# Patient Record
Sex: Female | Born: 1970 | Race: White | Hispanic: Yes | Marital: Married | Smoking: Never smoker
Health system: Southern US, Community
[De-identification: ages and names within clinical notes are randomized; demographics above are authoritative.]

## PROBLEM LIST (undated history)

## (undated) DIAGNOSIS — R011 Cardiac murmur, unspecified: Secondary | ICD-10-CM

## (undated) DIAGNOSIS — G43909 Migraine, unspecified, not intractable, without status migrainosus: Secondary | ICD-10-CM

## (undated) DIAGNOSIS — L729 Follicular cyst of the skin and subcutaneous tissue, unspecified: Secondary | ICD-10-CM

## (undated) HISTORY — PX: EYE SURGERY: SHX253

## (undated) HISTORY — DX: Migraine, unspecified, not intractable, without status migrainosus: G43.909

## (undated) HISTORY — DX: Follicular cyst of the skin and subcutaneous tissue, unspecified: L72.9

---

## 2005-08-06 ENCOUNTER — Ambulatory Visit: Payer: Self-pay | Admitting: Internal Medicine

## 2006-01-23 ENCOUNTER — Ambulatory Visit: Payer: Self-pay | Admitting: Internal Medicine

## 2006-02-11 ENCOUNTER — Ambulatory Visit: Payer: Self-pay | Admitting: Internal Medicine

## 2007-05-25 ENCOUNTER — Ambulatory Visit: Payer: Self-pay | Admitting: Internal Medicine

## 2011-12-10 ENCOUNTER — Ambulatory Visit: Payer: Self-pay

## 2011-12-10 LAB — CBC WITH DIFFERENTIAL/PLATELET
Basophil #: 0 10*3/uL (ref 0.0–0.1)
HCT: 39.1 % (ref 35.0–47.0)
HGB: 12.8 g/dL (ref 12.0–16.0)
Lymphocyte %: 31.6 %
MCHC: 32.8 g/dL (ref 32.0–36.0)
MCV: 84 fL (ref 80–100)
Monocyte #: 0.5 x10 3/mm (ref 0.2–0.9)
Monocyte %: 9.1 %
Neutrophil #: 3.2 10*3/uL (ref 1.4–6.5)
RBC: 4.64 10*6/uL (ref 3.80–5.20)
RDW: 13.7 % (ref 11.5–14.5)

## 2011-12-10 LAB — SEDIMENTATION RATE: Erythrocyte Sed Rate: 7 mm/hr (ref 0–20)

## 2011-12-10 LAB — URIC ACID: Uric Acid: 2.8 mg/dL (ref 2.6–6.0)

## 2012-07-01 DIAGNOSIS — L729 Follicular cyst of the skin and subcutaneous tissue, unspecified: Secondary | ICD-10-CM

## 2012-07-01 HISTORY — DX: Follicular cyst of the skin and subcutaneous tissue, unspecified: L72.9

## 2013-06-07 ENCOUNTER — Encounter: Payer: Self-pay | Admitting: *Deleted

## 2013-06-17 ENCOUNTER — Ambulatory Visit (INDEPENDENT_AMBULATORY_CARE_PROVIDER_SITE_OTHER): Payer: BC Managed Care – PPO | Admitting: General Surgery

## 2013-06-17 ENCOUNTER — Encounter: Payer: Self-pay | Admitting: General Surgery

## 2013-06-17 VITALS — BP 110/68 | HR 70 | Resp 12 | Ht 64.0 in | Wt 118.0 lb

## 2013-06-17 DIAGNOSIS — L0231 Cutaneous abscess of buttock: Secondary | ICD-10-CM

## 2013-06-17 NOTE — Patient Instructions (Signed)
Patient to return in 4-6 weeks

## 2013-06-17 NOTE — Progress Notes (Signed)
Patient ID: Denise Logan, female   DOB: October 03, 1970, 42 y.o.   MRN: [id number]  Chief Complaint  Patient presents with  . Cyst    left buttock    HPI Cambell Logan is a 42 y.o. female.  Here today for evaluation of a left buttock cyst.Patient states the abscess is been there for a year,never drained or busted before. She states on 06/06/13 she started having pain and saw Dr. Welton Flakes on the 06/07/13. Patient states she think it is trying to drain as of last night. HPI  Past Medical History  Diagnosis Date  . Cyst of buttocks 2014    Past Surgical History  Procedure Laterality Date  . Eye surgery Bilateral     Family History  Problem Relation Age of Onset  . Breast cancer Mother     Social History History  Substance Use Topics  . Smoking status: Never Smoker   . Smokeless tobacco: Never Used  . Alcohol Use: Yes    No Known Allergies  No current outpatient prescriptions on file.   No current facility-administered medications for this visit.    Review of Systems Review of Systems  Blood pressure 110/68, pulse 70, resp. rate 12, height 5\' 4"  (1.626 m), weight 118 lb (53.524 kg), last menstrual period 05/27/2013.  Physical Exam Physical Exam  Constitutional: She appears well-developed and well-nourished.  Skin:  3cm red swollen area with 3 tiny holes -pus seen through the openings. Located left upper inner gluteal area. Withe 1 ml 1% xylocaine and betadine prep, the openings were connected and 3-4 ml thick yellow pus evacuated.    Data Reviewed    Assessment    Left gluteal abscess.     Plan    Complete her course of antibiotic.         Christella App G 06/21/2013, 8:21 AM

## 2013-06-21 ENCOUNTER — Encounter: Payer: Self-pay | Admitting: General Surgery

## 2013-06-29 ENCOUNTER — Ambulatory Visit: Payer: Self-pay | Admitting: General Surgery

## 2013-07-20 ENCOUNTER — Encounter: Payer: Self-pay | Admitting: General Surgery

## 2013-07-20 ENCOUNTER — Ambulatory Visit (INDEPENDENT_AMBULATORY_CARE_PROVIDER_SITE_OTHER): Payer: BC Managed Care – PPO | Admitting: General Surgery

## 2013-07-20 VITALS — BP 100/60 | HR 72 | Resp 12 | Ht 64.0 in | Wt 118.8 lb

## 2013-07-20 DIAGNOSIS — L03317 Cellulitis of buttock: Principal | ICD-10-CM

## 2013-07-20 DIAGNOSIS — L0231 Cutaneous abscess of buttock: Secondary | ICD-10-CM

## 2013-07-20 NOTE — Patient Instructions (Signed)
Call for new issues or concerns.

## 2013-07-20 NOTE — Progress Notes (Signed)
Patient ID: Denise Logan, female   DOB: 1971/02/15, 43 y.o.   MRN: [id number]  Chief Complaint  Patient presents with  . Follow-up    gluteal abscess    HPI Denise Logan is a 43 y.o. female.  Here today for one month follow up left gluteal abscess.  States she is doing well. HPI  Past Medical History  Diagnosis Date  . Cyst of buttocks 2014    Past Surgical History  Procedure Laterality Date  . Eye surgery Bilateral     Family History  Problem Relation Age of Onset  . Breast cancer Mother     Social History History  Substance Use Topics  . Smoking status: Never Smoker   . Smokeless tobacco: Never Used  . Alcohol Use: Yes    Allergies  Allergen Reactions  . Codeine Other (See Comments)    headache    No current outpatient prescriptions on file.   No current facility-administered medications for this visit.    Review of Systems Review of Systems  Constitutional: Negative.   Respiratory: Negative.   Cardiovascular: Negative.     Blood pressure 100/60, pulse 72, resp. rate 12, height 5\' 4"  (1.626 m), weight 118 lb 12.8 oz (53.887 kg), last menstrual period 06/25/2013.  Physical Exam Physical Exam  Constitutional: She is oriented to person, place, and time. She appears well-developed and well-nourished.  Neurological: She is alert and oriented to person, place, and time.  Skin: Skin is warm and dry.  Left gluteal abscess has resolved. There is 1/2 inch skin scar that is visible and palpable. No induration or tracking in deeper tissue.    Data Reviewed    Assessment    Left upper gluteal abscess-resolved.     Plan    Return prn        Yarelli Decelles G 07/20/2013, 8:53 PM

## 2013-11-19 ENCOUNTER — Other Ambulatory Visit: Payer: Self-pay

## 2013-11-19 LAB — COMPREHENSIVE METABOLIC PANEL
ALK PHOS: 49 U/L
Albumin: 3.9 g/dL (ref 3.4–5.0)
Anion Gap: 3 — ABNORMAL LOW (ref 7–16)
BUN: 9 mg/dL (ref 7–18)
Bilirubin,Total: 0.4 mg/dL (ref 0.2–1.0)
CALCIUM: 8.9 mg/dL (ref 8.5–10.1)
CHLORIDE: 104 mmol/L (ref 98–107)
CO2: 30 mmol/L (ref 21–32)
Creatinine: 0.75 mg/dL (ref 0.60–1.30)
EGFR (African American): >60
GLUCOSE: 78 mg/dL (ref 65–99)
Osmolality: 271 (ref 275–301)
Potassium: 4.2 mmol/L (ref 3.5–5.1)
SGOT(AST): 25 U/L (ref 15–37)
SGPT (ALT): 13 U/L (ref 12–78)
SODIUM: 137 mmol/L (ref 136–145)
TOTAL PROTEIN: 7.7 g/dL (ref 6.4–8.2)

## 2013-11-19 LAB — CBC WITH DIFFERENTIAL/PLATELET
BASOS PCT: 0.7 %
Basophil #: 0 10*3/uL (ref 0.0–0.1)
Eosinophil #: 0.1 10*3/uL (ref 0.0–0.7)
Eosinophil %: 1.3 %
HCT: 36.6 % (ref 35.0–47.0)
HGB: 12.1 g/dL (ref 12.0–16.0)
Lymphocyte #: 1.5 10*3/uL (ref 1.0–3.6)
Lymphocyte %: 30.2 %
MCH: 27.8 pg (ref 26.0–34.0)
MCHC: 33 g/dL (ref 32.0–36.0)
MCV: 84 fL (ref 80–100)
MONOS PCT: 9.6 %
Monocyte #: 0.5 x10 3/mm (ref 0.2–0.9)
NEUTROS ABS: 3 10*3/uL (ref 1.4–6.5)
Neutrophil %: 58.2 %
Platelet: 213 10*3/uL (ref 150–440)
RBC: 4.36 10*6/uL (ref 3.80–5.20)
RDW: 13.7 % (ref 11.5–14.5)
WBC: 5.1 10*3/uL (ref 3.6–11.0)

## 2013-11-19 LAB — LIPID PANEL
CHOLESTEROL: 179 mg/dL (ref 0–200)
HDL Cholesterol: 67 mg/dL — ABNORMAL HIGH (ref 40–60)
LDL CHOLESTEROL, CALC: 94 mg/dL (ref 0–100)
Triglycerides: 90 mg/dL (ref 0–200)
VLDL Cholesterol, Calc: 18 mg/dL (ref 5–40)

## 2013-11-19 LAB — TSH: THYROID STIMULATING HORM: 1.22 u[IU]/mL

## 2013-11-19 LAB — HEMOGLOBIN A1C: Hemoglobin A1C: 5.7 % (ref 4.2–6.3)

## 2013-12-07 ENCOUNTER — Other Ambulatory Visit: Payer: Self-pay | Admitting: Physician Assistant

## 2013-12-07 LAB — URIC ACID: Uric Acid: 3.2 mg/dL (ref 2.6–6.0)

## 2013-12-07 LAB — SEDIMENTATION RATE: Erythrocyte Sed Rate: 14 mm/hr (ref 0–20)

## 2017-08-01 ENCOUNTER — Ambulatory Visit: Payer: Self-pay | Admitting: Internal Medicine

## 2017-08-13 ENCOUNTER — Ambulatory Visit: Payer: BLUE CROSS/BLUE SHIELD | Admitting: Internal Medicine

## 2017-08-13 ENCOUNTER — Encounter: Payer: Self-pay | Admitting: Internal Medicine

## 2017-08-13 VITALS — BP 101/64 | HR 88 | Resp 16 | Ht 64.0 in | Wt 124.8 lb

## 2017-08-13 DIAGNOSIS — Z124 Encounter for screening for malignant neoplasm of cervix: Secondary | ICD-10-CM

## 2017-08-13 DIAGNOSIS — M545 Low back pain, unspecified: Secondary | ICD-10-CM | POA: Insufficient documentation

## 2017-08-13 DIAGNOSIS — R102 Pelvic and perineal pain: Secondary | ICD-10-CM | POA: Diagnosis not present

## 2017-08-13 DIAGNOSIS — R519 Headache, unspecified: Secondary | ICD-10-CM | POA: Insufficient documentation

## 2017-08-13 DIAGNOSIS — R51 Headache: Secondary | ICD-10-CM | POA: Diagnosis not present

## 2017-08-13 DIAGNOSIS — J301 Allergic rhinitis due to pollen: Secondary | ICD-10-CM | POA: Diagnosis not present

## 2017-08-13 DIAGNOSIS — R300 Dysuria: Secondary | ICD-10-CM | POA: Diagnosis not present

## 2017-08-13 DIAGNOSIS — Z0001 Encounter for general adult medical examination with abnormal findings: Secondary | ICD-10-CM

## 2017-08-13 DIAGNOSIS — M064 Inflammatory polyarthropathy: Secondary | ICD-10-CM | POA: Insufficient documentation

## 2017-08-13 NOTE — Progress Notes (Signed)
[facility] [place], [place] [address]  Internal MEDICINE  Office Visit Note  Patient Name: Denise Logan  [id number]  [id number]  Date of Service: 08/13/2017  Chief Complaint  Patient presents with  . Annual Exam    with pap     HPI Pt is here for routine health maintenance examination Headaches associated with her cycle Ocassional pelvic pain    Current Medication: Outpatient Encounter Medications as of 08/13/2017  Medication Sig  . butalbital-aspirin-caffeine (FIORINAL) 50-325-40 MG capsule Take 1 capsule by mouth every 4 (four) hours as needed for headache.   No facility-administered encounter medications on file as of 08/13/2017.     Surgical History: Past Surgical History:  Procedure Laterality Date  . EYE SURGERY Bilateral     Medical History: Past Medical History:  Diagnosis Date  . Cyst of buttocks 2014    Family History: Family History  Problem Relation Age of Onset  . Breast cancer Mother     Review of Systems  Constitutional: Negative for activity change, appetite change, chills, diaphoresis, fatigue, fever and unexpected weight change.  HENT: Negative for congestion, ear discharge, ear pain, facial swelling, hearing loss, nosebleeds, postnasal drip, rhinorrhea, sinus pressure, sinus pain, sneezing, sore throat, tinnitus, trouble swallowing and voice change.   Eyes: Negative for photophobia, pain, discharge, redness, itching and visual disturbance.  Respiratory: Negative for apnea, cough, choking, chest tightness, shortness of breath, wheezing and stridor.   Cardiovascular: Negative for chest pain, palpitations and leg swelling.  Gastrointestinal: Negative for abdominal distention, abdominal pain, anal bleeding, constipation, diarrhea, nausea and rectal pain.  Endocrine: Negative for cold intolerance, heat intolerance, polydipsia, polyphagia and polyuria.  Genitourinary: Negative for difficulty urinating, flank pain,  frequency, genital sores, hematuria, menstrual problem, pelvic pain, urgency, vaginal bleeding, vaginal discharge and vaginal pain.  Musculoskeletal: Negative for arthralgias, back pain, gait problem, joint swelling, myalgias and neck pain.  Skin: Negative for color change, pallor, rash and wound.  Allergic/Immunologic: Negative for environmental allergies, food allergies and immunocompromised state.  Neurological: Positive for headaches. Negative for dizziness, seizures, syncope, facial asymmetry, speech difficulty, weakness, light-headedness and numbness.  Hematological: Negative for adenopathy. Does not bruise/bleed easily.  Psychiatric/Behavioral: Negative for agitation, behavioral problems, confusion, decreased concentration, dysphoric mood, hallucinations, self-injury, sleep disturbance and suicidal ideas. The patient is not nervous/anxious and is not hyperactive.      Vital Signs: BP 101/64 (BP Location: Left Arm, Patient Position: Sitting)   Pulse 88   Resp 16   Ht 5\' 4"  (1.626 m)   Wt 124 lb 12.8 oz (56.6 kg)   SpO2 100%   BMI 21.42 kg/m    Physical Exam  Constitutional: She appears well-developed and well-nourished. No distress.  HENT:  Head: Normocephalic and atraumatic.  Right Ear: External ear normal.  Left Ear: External ear normal.  Nose: Nose normal.  Mouth/Throat: Oropharynx is clear and moist. No oropharyngeal exudate.  Eyes: Conjunctivae and EOM are normal. Pupils are equal, round, and reactive to light. Right eye exhibits no discharge. Left eye exhibits no discharge. No scleral icterus.  Neck: Normal range of motion. Neck supple. No JVD present. No tracheal deviation present. No thyromegaly present.  Cardiovascular: Normal rate, regular rhythm, normal heart sounds and intact distal pulses. Exam reveals no gallop and no friction rub.  No murmur heard. Pulmonary/Chest: Effort normal and breath sounds normal. No stridor. No respiratory distress. She has no wheezes.  She has no rales. She exhibits no tenderness. Right breast exhibits nipple discharge.  Left breast exhibits no nipple discharge. Breasts are symmetrical.  Abdominal: Soft. Bowel sounds are normal. She exhibits no distension and no mass. There is no tenderness. There is no rebound and no guarding.  Genitourinary: Vagina normal and uterus normal. No vaginal discharge found.  Musculoskeletal: Normal range of motion. She exhibits no edema, tenderness or deformity.  Lymphadenopathy:    She has no cervical adenopathy.  Neurological: She is alert. She has normal reflexes. No cranial nerve deficit. She exhibits normal muscle tone. Coordination normal.  Skin: Skin is warm and dry. No rash noted. She is not diaphoretic. No erythema. No pallor.  Psychiatric: She has a normal mood and affect. Her behavior is normal. Judgment and thought content normal.   Assessment/Plan: 1. Encounter for general adult medical examination with abnormal findings - CBC with Differential/Platelet - Lipid Panel With LDL/HDL Ratio - TSH - T4, free - Urinalysis - Comprehensive metabolic panel  2. Nonintractable headache, unspecified chronicity pattern, unspecified headache type - Take OTC Tylenol prn   3. Allergic rhinitis due to pollen, unspecified seasonality - Flonase prn   4. Encounter for screening for malignant neoplasm of cervix - Pap IG and HPV (high risk) DNA detection  5. Dysuria - Urinalysis, Routine w reflex microscopic  6. Pelvic pain - US PELVIS (TRANSABDOMINAL ONLY); Future - CA 125  General Counseling: Denise Logan verbalizes understanding of the findings of todays visit and agrees with plan of treatment. I have discussed any further diagnostic evaluation that may be needed or ordered today. We also reviewed her medications today. she has been encouraged to call the office with any questions or concerns that should arise related to todays visit.   Orders Placed This Encounter  Procedures  . Urinalysis,  Routine w reflex microscopic    Time spent: [place], MD  Internal Medicine

## 2017-08-14 LAB — URINALYSIS, ROUTINE W REFLEX MICROSCOPIC
BILIRUBIN UA: NEGATIVE
GLUCOSE, UA: NEGATIVE
Ketones, UA: NEGATIVE
Leukocytes, UA: NEGATIVE
NITRITE UA: NEGATIVE
PH UA: 5 (ref 5.0–7.5)
PROTEIN UA: NEGATIVE
RBC UA: NEGATIVE
Specific Gravity, UA: 1.018 (ref 1.005–1.030)
UUROB: 0.2 mg/dL (ref 0.2–1.0)

## 2017-08-14 LAB — CBC WITH DIFFERENTIAL/PLATELET
BASOS ABS: 0 10*3/uL (ref 0.0–0.2)
Basos: 0 %
EOS (ABSOLUTE): 0 10*3/uL (ref 0.0–0.4)
Eos: 1 %
Hematocrit: 39.7 % (ref 34.0–46.6)
Hemoglobin: 13.2 g/dL (ref 11.1–15.9)
Immature Grans (Abs): 0 10*3/uL (ref 0.0–0.1)
Immature Granulocytes: 0 %
Lymphocytes Absolute: 1.3 10*3/uL (ref 0.7–3.1)
Lymphs: 27 %
MCH: 27.4 pg (ref 26.6–33.0)
MCHC: 33.2 g/dL (ref 31.5–35.7)
MCV: 83 fL (ref 79–97)
MONOCYTES: 9 %
MONOS ABS: 0.4 10*3/uL (ref 0.1–0.9)
Neutrophils Absolute: 2.9 10*3/uL (ref 1.4–7.0)
Neutrophils: 63 %
PLATELETS: 305 10*3/uL (ref 150–379)
RBC: 4.81 x10E6/uL (ref 3.77–5.28)
RDW: 13.9 % (ref 12.3–15.4)
WBC: 4.6 10*3/uL (ref 3.4–10.8)

## 2017-08-14 LAB — COMPREHENSIVE METABOLIC PANEL
A/G RATIO: 1.4 (ref 1.2–2.2)
ALK PHOS: 63 IU/L (ref 39–117)
ALT: 26 IU/L (ref 0–32)
AST: 31 IU/L (ref 0–40)
Albumin: 4.8 g/dL (ref 3.5–5.5)
BUN/Creatinine Ratio: 13 (ref 9–23)
BUN: 9 mg/dL (ref 6–24)
Bilirubin Total: 0.4 mg/dL (ref 0.0–1.2)
CHLORIDE: 101 mmol/L (ref 96–106)
CO2: 23 mmol/L (ref 20–29)
Calcium: 9.3 mg/dL (ref 8.7–10.2)
Creatinine, Ser: 0.68 mg/dL (ref 0.57–1.00)
GFR calc Af Amer: 121 mL/min/{1.73_m2} (ref >59)
GFR calc non Af Amer: 105 mL/min/{1.73_m2} (ref >59)
GLOBULIN, TOTAL: 3.4 g/dL (ref 1.5–4.5)
Glucose: 78 mg/dL (ref 65–99)
POTASSIUM: 4 mmol/L (ref 3.5–5.2)
SODIUM: 139 mmol/L (ref 134–144)
Total Protein: 8.2 g/dL (ref 6.0–8.5)

## 2017-08-14 LAB — LIPID PANEL WITH LDL/HDL RATIO
Cholesterol, Total: 221 mg/dL — ABNORMAL HIGH (ref 100–199)
HDL: 84 mg/dL (ref >39)
LDL Calculated: 122 mg/dL — ABNORMAL HIGH (ref 0–99)
LDL/HDL RATIO: 1.5 ratio (ref 0.0–3.2)
Triglycerides: 77 mg/dL (ref 0–149)
VLDL CHOLESTEROL CAL: 15 mg/dL (ref 5–40)

## 2017-08-14 LAB — TSH: TSH: 1.25 u[IU]/mL (ref 0.450–4.500)

## 2017-08-14 LAB — T4, FREE: Free T4: 1.07 ng/dL (ref 0.82–1.77)

## 2017-08-14 LAB — CA 125: CANCER ANTIGEN (CA) 125: 17.5 U/mL (ref 0.0–38.1)

## 2017-08-15 LAB — PAP IG AND HPV HIGH-RISK
HPV, high-risk: NEGATIVE
PAP Smear Comment: 0

## 2017-08-18 ENCOUNTER — Ambulatory Visit: Payer: BLUE CROSS/BLUE SHIELD

## 2017-08-18 DIAGNOSIS — R102 Pelvic and perineal pain: Secondary | ICD-10-CM | POA: Diagnosis not present

## 2017-08-22 ENCOUNTER — Telehealth: Payer: Self-pay

## 2017-08-22 NOTE — Telephone Encounter (Signed)
Spoke to husband and advised that all labs are normal.  dbs

## 2017-09-29 ENCOUNTER — Ambulatory Visit: Payer: BLUE CROSS/BLUE SHIELD | Admitting: Nurse Practitioner

## 2017-09-29 ENCOUNTER — Encounter: Payer: Self-pay | Admitting: Nurse Practitioner

## 2017-09-29 VITALS — BP 99/66 | HR 72 | Temp 98.1°F | Resp 16 | Ht 64.0 in | Wt 122.8 lb

## 2017-09-29 DIAGNOSIS — J029 Acute pharyngitis, unspecified: Secondary | ICD-10-CM | POA: Diagnosis not present

## 2017-09-29 DIAGNOSIS — J039 Acute tonsillitis, unspecified: Secondary | ICD-10-CM | POA: Diagnosis not present

## 2017-09-29 LAB — POCT RAPID STREP A (OFFICE): RAPID STREP A SCREEN: NEGATIVE

## 2017-09-29 MED ORDER — AMOXICILLIN 400 MG/5ML PO SUSR: 800.0000 mg | Freq: Two times a day (BID) | ORAL | 0 refills | Status: DC

## 2017-09-29 MED ORDER — FIRST-DUKES MOUTHWASH MT SUSP: OROMUCOSAL | 0 refills | Status: DC

## 2017-09-29 NOTE — Progress Notes (Signed)
[facility] [place], [place] [address]  Internal MEDICINE  Office Visit Note  Patient Name: Denise Logan  [id number]  [id number]  Date of Service: 10/22/2017    Pt is here for a sick visit. Chief Complaint  Patient presents with  . Sore Throat    right side looked in the mirror and seen redness  . Chills  . Cough  . Pain    hurts when eat and yawn  . Dehydration    possibly dehydrated     Sore Throat   This is a new problem. The current episode started in the past 7 days. The problem has been unchanged. There has been no fever. Associated symptoms include congestion, coughing, headaches, swollen glands and trouble swallowing. Pertinent negatives include no abdominal pain, diarrhea, neck pain, shortness of breath or vomiting. She has tried acetaminophen and gargles for the symptoms. The treatment provided mild relief.    Current Medication:  Outpatient Encounter Medications as of 09/29/2017  Medication Sig  . amoxicillin (AMOXIL) 400 MG/5ML suspension Take 10 mLs (800 mg total) by mouth 2 (two) times daily.  . butalbital-aspirin-caffeine (FIORINAL) 50-325-40 MG capsule Take 1 capsule by mouth every 4 (four) hours as needed for headache.  . Diphenhyd-Hydrocort-Nystatin (FIRST-DUKES MOUTHWASH) SUSP Swish and swallow 36mls Q4hours prn sore throat.   No facility-administered encounter medications on file as of 09/29/2017.       Medical History: Past Medical History:  Diagnosis Date  . Cyst of buttocks 2014     Today's Vitals   09/29/17 1648  BP: 99/66  Pulse: 72  Resp: 16  Temp: 98.1 F (36.7 C)  SpO2: 99%  Weight: 122 lb 12.8 oz (55.7 kg)  Height: 5\' 4"  (1.626 m)    Review of Systems  Constitutional: Positive for chills, fatigue and fever. Negative for unexpected weight change.  HENT: Positive for congestion, postnasal drip, sore throat, trouble swallowing and voice change. Negative for rhinorrhea and sneezing.   Eyes: Negative.   Negative for redness.  Respiratory: Positive for cough. Negative for chest tightness, shortness of breath and wheezing.   Cardiovascular: Negative for chest pain and palpitations.  Gastrointestinal: Positive for nausea. Negative for abdominal pain, constipation, diarrhea and vomiting.  Endocrine: Negative for cold intolerance, heat intolerance, polydipsia, polyphagia and polyuria.  Genitourinary: Negative.  Negative for dysuria and frequency.  Musculoskeletal: Positive for back pain. Negative for arthralgias, joint swelling and neck pain.  Skin: Negative for rash.  Allergic/Immunologic: Positive for environmental allergies.  Neurological: Positive for headaches. Negative for tremors and numbness.  Hematological: Negative for adenopathy. Does not bruise/bleed easily.  Psychiatric/Behavioral: Negative for behavioral problems (Depression), dysphoric mood, sleep disturbance and suicidal ideas. The patient is not nervous/anxious.     Physical Exam  Constitutional: She is oriented to person, place, and time. She appears well-developed and well-nourished. No distress.  HENT:  Head: Normocephalic and atraumatic.  Right Ear: Tympanic membrane is erythematous.  Left Ear: Tympanic membrane is erythematous.  Mouth/Throat: Posterior oropharyngeal edema and posterior oropharyngeal erythema present. No oropharyngeal exudate.    Eyes: Pupils are equal, round, and reactive to light. EOM are normal.  Neck: Normal range of motion. Neck supple. No JVD present. No tracheal deviation present. No thyromegaly present.  Cardiovascular: Normal rate, regular rhythm and normal heart sounds. Exam reveals no gallop and no friction rub.  No murmur heard. Pulmonary/Chest: Effort normal and breath sounds normal. No respiratory distress. She has no wheezes. She has no rales. She exhibits no  tenderness.  Abdominal: Soft. Bowel sounds are normal. There is no tenderness.  Musculoskeletal: Normal range of motion.    Lymphadenopathy:    She has cervical adenopathy.  Neurological: She is alert and oriented to person, place, and time. No cranial nerve deficit.  Skin: Skin is warm and dry. She is not diaphoretic.  Psychiatric: She has a normal mood and affect. Her behavior is normal. Judgment and thought content normal.  Nursing note and vitals reviewed.  Assessment/Plan: 1. Sore throat - POCT rapid strep A negative. Will treat for infection based on symptoms.  - Diphenhyd-Hydrocort-Nystatin (FIRST-DUKES MOUTHWASH) SUSP; Swish and swallow 47mls Q4hours prn sore throat.  Dispense: 200 mL; Refill: 0  2. Acute tonsillitis, unspecified etiology Treat with amoxicillin suspension bid for 10 days. Gargle with duke's magic mouthwash and warm salt water as needed. Tylenol/ibuprofen as needed for pain/fever.  - amoxicillin (AMOXIL) 400 MG/5ML suspension; Take 10 mLs (800 mg total) by mouth 2 (two) times daily.  Dispense: 200 mL; Refill: 0  General Counseling: Denise Logan verbalizes understanding of the findings of todays visit and agrees with plan of treatment. I have discussed any further diagnostic evaluation that may be needed or ordered today. We also reviewed her medications today. she has been encouraged to call the office with any questions or concerns that should arise related to todays visit.  Rest and increase fluids. Continue using OTC medication to control symptoms.   This patient was seen by Leretha Pol, FNP- C in Collaboration with Dr Lavera Guise as a part of collaborative care agreement    Orders Placed This Encounter  Procedures  . POCT rapid strep A    Meds ordered this encounter  Medications  . amoxicillin (AMOXIL) 400 MG/5ML suspension    Sig: Take 10 mLs (800 mg total) by mouth 2 (two) times daily.    Dispense:  200 mL    Refill:  0    Order Specific Question:   Supervising Provider    Answer:   Lavera Guise [[id number]]  . Diphenhyd-Hydrocort-Nystatin (FIRST-DUKES MOUTHWASH) SUSP    Sig:  Swish and swallow 90mls Q4hours prn sore throat.    Dispense:  200 mL    Refill:  0    Order Specific Question:   Supervising Provider    Answer:   Lavera Guise [[id number]]    Time spent: 15 Minutes

## 2017-10-22 DIAGNOSIS — J029 Acute pharyngitis, unspecified: Secondary | ICD-10-CM | POA: Insufficient documentation

## 2017-10-22 DIAGNOSIS — J039 Acute tonsillitis, unspecified: Secondary | ICD-10-CM | POA: Insufficient documentation

## 2018-03-13 ENCOUNTER — Encounter: Payer: Self-pay | Admitting: Nurse Practitioner

## 2018-03-13 ENCOUNTER — Ambulatory Visit: Payer: BLUE CROSS/BLUE SHIELD | Admitting: Nurse Practitioner

## 2018-03-13 VITALS — BP 99/66 | HR 93 | Resp 16 | Ht 64.0 in | Wt 120.8 lb

## 2018-03-13 DIAGNOSIS — B3731 Acute candidiasis of vulva and vagina: Secondary | ICD-10-CM | POA: Insufficient documentation

## 2018-03-13 DIAGNOSIS — B373 Candidiasis of vulva and vagina: Secondary | ICD-10-CM | POA: Diagnosis not present

## 2018-03-13 DIAGNOSIS — R300 Dysuria: Secondary | ICD-10-CM | POA: Diagnosis not present

## 2018-03-13 LAB — POCT URINALYSIS DIPSTICK
BILIRUBIN UA: NEGATIVE
Glucose, UA: NEGATIVE
KETONES UA: NEGATIVE
Leukocytes, UA: NEGATIVE
NITRITE UA: NEGATIVE
PH UA: 6 (ref 5.0–8.0)
PROTEIN UA: POSITIVE — AB
RBC UA: NEGATIVE
Spec Grav, UA: 1.025 (ref 1.010–1.025)
UROBILINOGEN UA: 0.2 U/dL

## 2018-03-13 MED ORDER — NYSTATIN 100000 UNIT/GM EX POWD: Freq: Three times a day (TID) | CUTANEOUS | 1 refills | Status: DC

## 2018-03-13 MED ORDER — NYSTATIN 100000 UNIT/GM EX OINT: 1.0000 "application " | TOPICAL_OINTMENT | Freq: Two times a day (BID) | CUTANEOUS | 1 refills | Status: DC

## 2018-03-13 MED ORDER — FLUCONAZOLE 150 MG PO TABS: ORAL_TABLET | ORAL | 0 refills | Status: DC

## 2018-03-13 NOTE — Progress Notes (Signed)
[facility] [place], [place] [address]  Internal MEDICINE  Office Visit Note  Patient Name: Denise Logan  [id number]  [id number]  Date of Service: 03/13/2018   Pt is here for a sick visit.  Chief Complaint  Patient presents with  . Vaginal Itching    pt believes to have yeast infection, she states she feels bumps, itching, and odor . symptoms started a few days ago     Vaginal Itching  The patient's primary symptoms include genital itching, pelvic pain and vaginal discharge. Associated symptoms include back pain, dysuria, headaches and nausea. Pertinent negatives include no abdominal pain, chills, constipation, diarrhea, fever, frequency, rash, sore throat or vomiting.    Patient arrives to primary care today with complaint of recurrent vaginal yeast infection. The first symptoms of itching started three days prior to patient's last periods 8/23 and has intermediately continued up to the present time in spite of the use OTC medication, Monistat.  Patient noticed some whitish vaginal discharge without odor on several occasions. Patient denies any fever, chills, body aches, nausea or any skin rashes. Patient denies the possibility of pregnancy. Patient denies any changes in hygiene, diet or sexual activities. Patient reports occasional pelvic pain but denies any pain at this moment. Pelvic ultrasound was done in February, 2019 due to the same complaint of pelvic pain. Pelvic ultrasound revealed the presence of left ovarian cyst but otherwise was unremarkable. Patient reports occasional burning sensation with urination but denies urgency, frequency or presence of blood in urine.        Current Medication:  Outpatient Encounter Medications as of 03/13/2018  Medication Sig  . amoxicillin (AMOXIL) 400 MG/5ML suspension Take 10 mLs (800 mg total) by mouth 2 (two) times daily. (Patient not taking: Reported on 03/13/2018)  . butalbital-aspirin-caffeine (FIORINAL)  50-325-40 MG capsule Take 1 capsule by mouth every 4 (four) hours as needed for headache.  . Diphenhyd-Hydrocort-Nystatin (FIRST-DUKES MOUTHWASH) SUSP Swish and swallow 54mls Q4hours prn sore throat. (Patient not taking: Reported on 03/13/2018)  . fluconazole (DIFLUCAN) 150 MG tablet Take 1 tablet po once. May repeat dose in 3 days as needed for persistent symptoms.  Marland Kitchen nystatin (MYCOSTATIN/NYSTOP) powder Apply topically 3 (three) times daily.   No facility-administered encounter medications on file as of 03/13/2018.       Medical History: Past Medical History:  Diagnosis Date  . Cyst of buttocks 2014   Today's Vitals   03/13/18 0952  BP: 99/66  Pulse: 93  Resp: 16  SpO2: 98%  Weight: 120 lb 12.8 oz (54.8 kg)  Height: 5\' 4"  (1.626 m)    Review of Systems  Constitutional: Negative for chills, fatigue, fever and unexpected weight change.  HENT: Negative for congestion, postnasal drip, rhinorrhea, sneezing, sore throat, trouble swallowing and voice change.   Eyes: Negative.  Negative for redness.  Respiratory: Negative for cough, chest tightness, shortness of breath and wheezing.   Cardiovascular: Negative for chest pain and palpitations.  Gastrointestinal: Positive for nausea. Negative for abdominal pain, constipation, diarrhea and vomiting.  Endocrine: Negative for cold intolerance, heat intolerance, polydipsia, polyphagia and polyuria.  Genitourinary: Positive for dysuria, pelvic pain and vaginal discharge. Negative for frequency.  Musculoskeletal: Positive for back pain. Negative for arthralgias, joint swelling and neck pain.  Skin: Negative for rash.  Allergic/Immunologic: Positive for environmental allergies.  Neurological: Positive for headaches. Negative for tremors and numbness.  Hematological: Negative for adenopathy. Does not bruise/bleed easily.  Psychiatric/Behavioral: Negative for behavioral problems (Depression), dysphoric mood,  sleep disturbance and suicidal ideas. The  patient is not nervous/anxious.   All other systems reviewed and are negative.   Physical Exam  Constitutional: She is oriented to person, place, and time. She appears well-developed and well-nourished. No distress.  HENT:  Head: Normocephalic and atraumatic.  Mouth/Throat: Oropharynx is clear and moist. No oropharyngeal exudate.  Eyes: Pupils are equal, round, and reactive to light. EOM are normal.  Neck: Normal range of motion. Neck supple. No JVD present. No tracheal deviation present. No thyromegaly present.  Cardiovascular: Normal rate, regular rhythm and normal heart sounds. Exam reveals no gallop and no friction rub.  No murmur heard. Pulmonary/Chest: Effort normal and breath sounds normal. No respiratory distress. She has no wheezes. She has no rales. She exhibits no tenderness.  Abdominal: Soft. Bowel sounds are normal.  Genitourinary:  Genitourinary Comments: Due to patient's complaint of occasional pelvic pain and burning sensation with urination, uranalysis was ordered to check for UTI. UA came positive only for presence of protein in urine.  Musculoskeletal: Normal range of motion.  Lymphadenopathy:    She has no cervical adenopathy.  Neurological: She is alert and oriented to person, place, and time. No cranial nerve deficit.  Skin: Skin is warm and dry. She is not diaphoretic.  Psychiatric: She has a normal mood and affect. Her behavior is normal. Judgment and thought content normal.  Nursing note and vitals reviewed.  Assessment/Plan:   1. Vaginal candidiasis Diflucan 150mg  once. May repeat in three days for persistent symptoms. Add nystatin cream three times daily. - fluconazole (DIFLUCAN) 150 MG tablet; Take 1 tablet po once. May repeat dose in 3 days as needed for persistent symptoms.  Dispense: 3 tablet; Refill: 0 - nystatin (MYCOSTATIN/NYSTOP) powder; Apply topically 3 (three) times daily.  Dispense: 60 g; Refill: 1  2. Dysuria - POCT Urinalysis Dipstick positive  for protein only. No evidence uti.  Patient was prescribed Diflucan and Nystatin cream to help to relieve the symptoms. Patient was educated about diet such as decrease sugar intake, try to take probiotics and increase fluid intake. The follow up visit set up in 2 weeks if symptoms do not get better.    General Counseling: Denise Logan understanding of the findings of todays visit and agrees with plan of treatment. I have discussed any further diagnostic evaluation that may be needed or ordered today. We also reviewed her medications today. she has been encouraged to call the office with any questions or concerns that should arise related to todays visit.    Counseling:  This patient was seen by Leretha Pol FNP Collaboration with Dr Lavera Guise as a part of collaborative care agreement  Orders Placed This Encounter  Procedures  . POCT Urinalysis Dipstick    Meds ordered this encounter  Medications  . fluconazole (DIFLUCAN) 150 MG tablet    Sig: Take 1 tablet po once. May repeat dose in 3 days as needed for persistent symptoms.    Dispense:  3 tablet    Refill:  0    Order Specific Question:   Supervising Provider    Answer:   Lavera Guise [[id number]]  . nystatin (MYCOSTATIN/NYSTOP) powder    Sig: Apply topically 3 (three) times daily.    Dispense:  60 g    Refill:  1    Order Specific Question:   Supervising Provider    Answer:   Lavera Guise [[id number]]    Time spent: 15 Minutes

## 2018-03-13 NOTE — Addendum Note (Signed)
Addended by: Leretha Pol on: 03/13/2018 01:55 PM   Modules accepted: Orders

## 2018-03-27 ENCOUNTER — Ambulatory Visit: Payer: BLUE CROSS/BLUE SHIELD | Admitting: Adult Health

## 2018-03-27 ENCOUNTER — Encounter: Payer: Self-pay | Admitting: Adult Health

## 2018-03-27 VITALS — BP 90/58 | HR 97 | Temp 98.6°F | Resp 16 | Ht 65.0 in | Wt 121.2 lb

## 2018-03-27 DIAGNOSIS — J039 Acute tonsillitis, unspecified: Secondary | ICD-10-CM | POA: Diagnosis not present

## 2018-03-27 DIAGNOSIS — J029 Acute pharyngitis, unspecified: Secondary | ICD-10-CM

## 2018-03-27 LAB — POCT RAPID STREP A (OFFICE): RAPID STREP A SCREEN: NEGATIVE

## 2018-03-27 MED ORDER — AMOXICILLIN 400 MG/5ML PO SUSR: 800.0000 mg | Freq: Two times a day (BID) | ORAL | 0 refills | Status: DC

## 2018-03-27 NOTE — Patient Instructions (Signed)
Tonsillitis Tonsillitis is an infection of the throat. This infection causes the tonsils to become red, tender, and swollen. Tonsils are tissues in the back of your throat. If bacteria caused your infection, antibiotic medicine will be given to you. Sometimes, symptoms of this infection can be helped with the use of steroid medicine. If your tonsillitis is very bad (severe) and happens often, you may need to get your tonsils removed (tonsillectomy). Follow these instructions at home: Medicines  Take over-the-counter and prescription medicines only as told by your doctor.  If you were prescribed an antibiotic, take it as told by your doctor. Do not stop taking the antibiotic even if you start to feel better. Eating and drinking  Drink enough fluid to keep your pee (urine) clear or pale yellow.  While your throat is sore, eat soft or liquid foods like: ? Soup. ? Sherbert. ? Instant breakfast drinks.  Drink warm fluids.  Eat frozen ice pops. General instructions  Rest as much as possible and get plenty of sleep.  Gargle with a salt-water mixture 3-4 times a day or as needed. To make a salt-water mixture, completely dissolve -1 tsp of salt in 1 cup of warm water.  Wash your hands often with soap and water. If there is no soap and water, use hand sanitizer.  Do not share cups, bottles, or other utensils until your symptoms are gone.  Do not smoke. If you need help quitting, ask your doctor.  Keep all follow-up visits as told by your doctor. This is important. Contact a doctor if:  You have large, tender lumps in your neck.  You have a fever that does not go away after 2-3 days.  You have a rash.  You cough up green, yellow-brown, or bloody fluid.  You cannot swallow liquids or food for 24 hours.  Only one of your tonsils is swollen. Get help right away if:  You have any new symptoms such as: ? Vomiting ? Very bad headache ? Stiff neck ? Chest pain ? Trouble breathing  or swallowing  You have very bad throat pain and also have drooling or voice changes.  You have very bad pain that is not helped by medicine.  You cannot fully open your mouth.  You have redness, swelling, or severe pain anywhere in your neck. Summary  Tonsillitis causes your tonsils to be red, tender, and swollen.  While your throat is sore eat soft or liquid foods.  Gargle with a salt-water mixture 3-4 times a day or as needed.  Do not share cups, bottles, or other utensils until your symptoms are gone. This information is not intended to replace advice given to you by your health care provider. Make sure you discuss any questions you have with your health care provider. Document Released: 12/04/2007 Document Revised: 11/23/2015 Document Reviewed: 12/04/2012 Elsevier Interactive Patient Education  2017 Elsevier Inc.  

## 2018-03-27 NOTE — Progress Notes (Signed)
[facility], [place] [address]  Internal MEDICINE  Office Visit Note  Patient Name: Denise Logan  [id number]  [id number]  Date of Service: 03/27/2018  Chief Complaint  Patient presents with  . Sore Throat  . Generalized Body Aches    HPI Pt is here for a sick visit. She reports feeling tired three days ago.  Yesterday she started having a sore throat and body aches.  She felt feverish, and chills however she did not take her temperature.  She has been taking tylenol but nothing else.  She denies sick contacts at this time. 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    Current Medication:  Outpatient Encounter Medications as of 03/27/2018  Medication Sig  . amoxicillin (AMOXIL) 400 MG/5ML suspension Take 10 mLs (800 mg total) by mouth 2 (two) times daily.  . butalbital-aspirin-caffeine (FIORINAL) 50-325-40 MG capsule Take 1 capsule by mouth every 4 (four) hours as needed for headache.  . fluconazole (DIFLUCAN) 150 MG tablet Take 1 tablet po once. May repeat dose in 3 days as needed for persistent symptoms. (Patient not taking: Reported on 03/27/2018)  . nystatin ointment (MYCOSTATIN) Apply 1 application topically 2 (two) times daily. (Patient not taking: Reported on 03/27/2018)   No facility-administered encounter medications on file as of 03/27/2018.       Medical History: Past Medical History:  Diagnosis Date  . Cyst of buttocks 2014     Vital Signs: BP (!) 90/58   Pulse 97   Temp 98.6 F (37 C)   Resp 16   Ht 5\' 5"  (1.651 m)   Wt 121 lb 3.2 oz (55 kg)   SpO2 98%   BMI 20.17 kg/m    Review of Systems  Constitutional: Positive for fever. Negative for chills, fatigue and unexpected weight change.  HENT: Positive for sore throat and trouble swallowing. Negative for congestion, rhinorrhea and sneezing.   Eyes: Negative for photophobia, pain and redness.  Respiratory: Negative for cough, chest tightness  and shortness of breath.   Cardiovascular: Negative for chest pain and palpitations.  Gastrointestinal: Negative for abdominal pain, constipation, diarrhea, nausea and vomiting.  Endocrine: Negative.   Genitourinary: Negative for dysuria and frequency.  Musculoskeletal: Negative for arthralgias, back pain, joint swelling and neck pain.  Skin: Negative for rash.  Allergic/Immunologic: Negative.   Neurological: Negative for tremors and numbness.  Hematological: Negative for adenopathy. Does not bruise/bleed easily.  Psychiatric/Behavioral: Negative for behavioral problems and sleep disturbance. The patient is not nervous/anxious.     Physical Exam  Constitutional: She is oriented to person, place, and time. She appears well-developed and well-nourished. No distress.  HENT:  Head: Normocephalic and atraumatic.  Right Ear: Hearing, tympanic membrane and ear canal normal.  Left Ear: Hearing, tympanic membrane and ear canal normal.  Mouth/Throat: Uvula is midline. Mucous membranes are pale. Posterior oropharyngeal edema present. No oropharyngeal exudate. Tonsils are 1+ on the right. Tonsils are 1+ on the left. No tonsillar exudate.  Eyes: Pupils are equal, round, and reactive to light. EOM are normal.  Neck: Normal range of motion. Neck supple. No JVD present. No tracheal deviation present. No thyromegaly present.  Cardiovascular: Normal rate, regular rhythm and normal heart sounds. Exam reveals no gallop and no friction rub.  No murmur heard. Pulmonary/Chest: Effort normal and breath sounds normal. No respiratory distress. She has no wheezes. She has no rales. She exhibits no tenderness.  Abdominal: Soft. There  is no tenderness. There is no guarding.  Musculoskeletal: Normal range of motion.  Lymphadenopathy:    She has no cervical adenopathy.  Neurological: She is alert and oriented to person, place, and time. No cranial nerve deficit.  Skin: Skin is warm and dry. She is not diaphoretic.   Psychiatric: She has a normal mood and affect. Her behavior is normal. Judgment and thought content normal.  Nursing note and vitals reviewed.  Assessment/Plan: 1. Acute tonsillitis, unspecified etiology Take Amoxil as prescribed.  Return to clinic for new or worsening symptoms.  - amoxicillin (AMOXIL) 400 MG/5ML suspension; Take 10 mLs (800 mg total) by mouth 2 (two) times daily.  Dispense: 200 mL; Refill: 0  2. Sore throat Rapid Strep Negative at this time.  - POCT rapid strep A  General Counseling: Jazminn verbalizes understanding of the findings of todays visit and agrees with plan of treatment. I have discussed any further diagnostic evaluation that may be needed or ordered today. We also reviewed her medications today. she has been encouraged to call the office with any questions or concerns that should arise related to todays visit.   Orders Placed This Encounter  Procedures  . POCT rapid strep A    Meds ordered this encounter  Medications  . amoxicillin (AMOXIL) 400 MG/5ML suspension    Sig: Take 10 mLs (800 mg total) by mouth 2 (two) times daily.    Dispense:  200 mL    Refill:  0    Time spent: 20 Minutes  This patient was seen by Orson Gear AGNP-C in Collaboration with Dr Lavera Guise as a part of collaborative care agreement

## 2018-03-30 ENCOUNTER — Ambulatory Visit: Payer: Self-pay | Admitting: Nurse Practitioner

## 2018-06-14 ENCOUNTER — Other Ambulatory Visit: Payer: Self-pay | Admitting: Internal Medicine

## 2018-06-15 ENCOUNTER — Other Ambulatory Visit: Payer: Self-pay | Admitting: Nurse Practitioner

## 2018-06-15 ENCOUNTER — Other Ambulatory Visit: Payer: Self-pay

## 2018-06-15 DIAGNOSIS — R519 Headache, unspecified: Secondary | ICD-10-CM

## 2018-06-15 DIAGNOSIS — R51 Headache: Principal | ICD-10-CM

## 2018-06-15 MED ORDER — BUTALBITAL-ASA-CAFFEINE 50-325-40 MG PO CAPS: 1.0000 | ORAL_CAPSULE | ORAL | 2 refills | Status: DC | PRN

## 2018-06-15 NOTE — Telephone Encounter (Signed)
Renewed rx for butalbital/APAP/caffeine. Sent to her pharmacy.

## 2018-06-15 NOTE — Telephone Encounter (Signed)
Can you send next appt 2/19

## 2018-06-15 NOTE — Progress Notes (Signed)
Renewed rx for butalbital/APAP/caffeine. Sent to her pharmacy.

## 2018-07-07 ENCOUNTER — Ambulatory Visit: Payer: Self-pay | Admitting: Nurse Practitioner

## 2018-07-13 ENCOUNTER — Ambulatory Visit: Payer: BLUE CROSS/BLUE SHIELD | Admitting: Nurse Practitioner

## 2018-08-13 ENCOUNTER — Other Ambulatory Visit: Payer: Self-pay | Admitting: Nurse Practitioner

## 2018-10-22 ENCOUNTER — Telehealth: Payer: Self-pay | Admitting: Internal Medicine

## 2018-10-22 NOTE — Telephone Encounter (Signed)
Left message for patient to call and r/s her physical

## 2019-02-01 ENCOUNTER — Encounter: Payer: Self-pay | Admitting: Adult Health

## 2019-02-01 ENCOUNTER — Ambulatory Visit (INDEPENDENT_AMBULATORY_CARE_PROVIDER_SITE_OTHER): Payer: BLUE CROSS/BLUE SHIELD | Admitting: Adult Health

## 2019-02-01 ENCOUNTER — Other Ambulatory Visit: Payer: Self-pay

## 2019-02-01 VITALS — BP 109/65 | HR 81 | Temp 98.1°F | Resp 16 | Ht 64.0 in | Wt 105.0 lb

## 2019-02-01 DIAGNOSIS — N644 Mastodynia: Secondary | ICD-10-CM | POA: Diagnosis not present

## 2019-02-01 DIAGNOSIS — N63 Unspecified lump in unspecified breast: Secondary | ICD-10-CM

## 2019-02-01 NOTE — Progress Notes (Signed)
[facility] [place], [place] [address]  Internal MEDICINE  Office Visit Note  Patient Name: Denise Logan  [id number]  [id number]  Date of Service: 02/01/2019  Chief Complaint  Patient presents with  . Breast Pain    right side breast pain, started last wednesday about the same      HPI Pt is here for a sick visit. Pt report a burning pain in her right breast.  It has been persistent for 4-5 days.  She does not see a rash or any changes to her skin.  She reports that it is so tender, she does not want to have clothes on it.  She needs something loose around her. She denies every having shingles before.     Current Medication:  Outpatient Encounter Medications as of 02/01/2019  Medication Sig  . butalbital-aspirin-caffeine (FIORINAL) 50-325-40 MG capsule Take 1 capsule by mouth every 4 (four) hours as needed for headache.  . [DISCONTINUED] amoxicillin (AMOXIL) 400 MG/5ML suspension Take 10 mLs (800 mg total) by mouth 2 (two) times daily. (Patient not taking: Reported on 02/01/2019)  . [DISCONTINUED] fluconazole (DIFLUCAN) 150 MG tablet Take 1 tablet po once. May repeat dose in 3 days as needed for persistent symptoms. (Patient not taking: Reported on 03/27/2018)  . [DISCONTINUED] nystatin ointment (MYCOSTATIN) Apply 1 application topically 2 (two) times daily. (Patient not taking: Reported on 03/27/2018)   No facility-administered encounter medications on file as of 02/01/2019.       Medical History: Past Medical History:  Diagnosis Date  . Cyst of buttocks 2014     Vital Signs: BP 109/65   Pulse 81   Temp 98.1 F (36.7 C)   Resp 16   Ht 5\' 4"  (1.626 m)   Wt 105 lb (47.6 kg)   SpO2 100%   BMI 18.02 kg/m    Review of Systems  Physical Exam Vitals signs and nursing note reviewed.  Constitutional:      General: She is not in acute distress.    Appearance: She is well-developed. She is not diaphoretic.  HENT:     Head: Normocephalic and  atraumatic.     Mouth/Throat:     Pharynx: No oropharyngeal exudate.  Eyes:     Pupils: Pupils are equal, round, and reactive to light.  Neck:     Musculoskeletal: Normal range of motion and neck supple.     Thyroid: No thyromegaly.     Vascular: No JVD.     Trachea: No tracheal deviation.  Cardiovascular:     Rate and Rhythm: Normal rate and regular rhythm.     Heart sounds: Normal heart sounds. No murmur. No friction rub. No gallop.   Pulmonary:     Effort: Pulmonary effort is normal. No respiratory distress.     Breath sounds: Normal breath sounds. No wheezing or rales.  Chest:     Chest wall: No tenderness.     Breasts:        Right: Mass and tenderness present.        Comments: approx 2cm mass in right breast,  Abdominal:     Palpations: Abdomen is soft.     Tenderness: There is no abdominal tenderness. There is no guarding.  Musculoskeletal: Normal range of motion.  Lymphadenopathy:     Cervical: No cervical adenopathy.  Skin:    General: Skin is warm and dry.  Neurological:     Mental Status: She is alert and oriented to person, place, and time.  Cranial Nerves: No cranial nerve deficit.  Psychiatric:        Behavior: Behavior normal.        Thought Content: Thought content normal.        Judgment: Judgment normal.    Assessment/Plan:  1. Breast pain in female No current rash, discussed signs of shingles and to call clinic if any rash develops in the next few days.  Skin appears in tact.  2. Breast lump There is an approximate 2cm mass in the right breast.  She reports just finding it in the last few days. On exam, feels cyst like, is moveable.  - MM Digital Diagnostic Bilat; Future LPM 2 weeks ago  General Counseling: Sueann verbalizes understanding of the findings of todays visit and agrees with plan of treatment. I have discussed any further diagnostic evaluation that may be needed or ordered today. We also reviewed her medications today. she has been  encouraged to call the office with any questions or concerns that should arise related to todays visit.   No orders of the defined types were placed in this encounter.   No orders of the defined types were placed in this encounter.   Time spent: 20 Minutes  This patient was seen by Orson Gear AGNP-C in Collaboration with Dr Lavera Guise as a part of collaborative care agreement.  Kendell Bane AGNP-C Internal Medicine

## 2019-02-16 ENCOUNTER — Other Ambulatory Visit: Payer: Self-pay | Admitting: Adult Health

## 2019-02-16 DIAGNOSIS — Z1231 Encounter for screening mammogram for malignant neoplasm of breast: Secondary | ICD-10-CM

## 2019-02-16 DIAGNOSIS — N6310 Unspecified lump in the right breast, unspecified quadrant: Secondary | ICD-10-CM

## 2019-02-26 ENCOUNTER — Ambulatory Visit
Admission: RE | Admit: 2019-02-26 | Discharge: 2019-02-26 | Disposition: A | Discharge status: Home / Self Care | Payer: Self-pay | Source: Ambulatory Visit | Attending: Adult Health | Admitting: Adult Health

## 2019-02-26 DIAGNOSIS — Z1231 Encounter for screening mammogram for malignant neoplasm of breast: Secondary | ICD-10-CM

## 2019-02-26 DIAGNOSIS — N6310 Unspecified lump in the right breast, unspecified quadrant: Secondary | ICD-10-CM | POA: Insufficient documentation

## 2019-08-18 ENCOUNTER — Telehealth: Payer: Self-pay

## 2019-08-18 NOTE — Telephone Encounter (Signed)
Denied med need appt for refills and gave kim to call pt for appt

## 2019-08-18 NOTE — Telephone Encounter (Signed)
Confirmed appointment on 08/23/2019 and screened for covid. [facility]  

## 2019-08-23 ENCOUNTER — Other Ambulatory Visit: Payer: Self-pay

## 2019-08-23 ENCOUNTER — Encounter: Payer: Self-pay | Admitting: Adult Health

## 2019-08-23 ENCOUNTER — Ambulatory Visit (INDEPENDENT_AMBULATORY_CARE_PROVIDER_SITE_OTHER): Payer: 59 | Admitting: Adult Health

## 2019-08-23 VITALS — BP 101/60 | HR 77 | Temp 97.5°F | Resp 16 | Ht 64.0 in | Wt 105.0 lb

## 2019-08-23 DIAGNOSIS — R519 Headache, unspecified: Secondary | ICD-10-CM

## 2019-08-23 DIAGNOSIS — Z0001 Encounter for general adult medical examination with abnormal findings: Secondary | ICD-10-CM

## 2019-08-23 MED ORDER — BUTALBITAL-ASA-CAFFEINE 50-325-40 MG PO CAPS: 1.0000 | ORAL_CAPSULE | ORAL | 0 refills | Status: DC | PRN

## 2019-08-23 NOTE — Progress Notes (Signed)
[facility] [place], [place] [address]  Internal MEDICINE  Office Visit Note  Patient Name: Denise Logan  [id number]  [id number]  Date of Service: 08/23/2019  Chief Complaint  Patient presents with  . Follow-up  . Medication Refill    HPI  Pt is seen here for follow up.  She has a history of Headaches. Overall she is doing well.  She had a few days of migraine last week and she is now out of her medication. She takes Fiorinal with good results.  The prescription was given in December 2019 with 2 refills and she never had it refilled because they expired before she needed them. She reports some photosensitivity with migraines.  She denies any other symptoms. She does wear glasses and she has not had them checked this year.     Current Medication: Outpatient Encounter Medications as of 08/23/2019  Medication Sig  . butalbital-aspirin-caffeine (FIORINAL) 50-325-40 MG capsule Take 1 capsule by mouth every 4 (four) hours as needed for headache.  . [DISCONTINUED] butalbital-aspirin-caffeine (FIORINAL) 50-325-40 MG capsule Take 1 capsule by mouth every 4 (four) hours as needed for headache.   No facility-administered encounter medications on file as of 08/23/2019.    Surgical History: Past Surgical History:  Procedure Laterality Date  . EYE SURGERY Bilateral     Medical History: Past Medical History:  Diagnosis Date  . Cyst of buttocks 2014    Family History: Family History  Problem Relation Age of Onset  . Breast cancer Mother 75    Social History   Socioeconomic History  . Marital status: Married    Spouse name: Not on file  . Number of children: Not on file  . Years of education: Not on file  . Highest education level: Not on file  Occupational History  . Not on file  Tobacco Use  . Smoking status: Never Smoker  . Smokeless tobacco: Never Used  Substance and Sexual Activity  . Alcohol use: Yes    Comment: occasionally  . Drug use: No   . Sexual activity: Not on file  Other Topics Concern  . Not on file  Social History Narrative  . Not on file   Social Determinants of Health   Financial Resource Strain:   . Difficulty of Paying Living Expenses: Not on file  Food Insecurity:   . Worried About Charity fundraiser in the Last Year: Not on file  . Ran Out of Food in the Last Year: Not on file  Transportation Needs:   . Lack of Transportation (Medical): Not on file  . Lack of Transportation (Non-Medical): Not on file  Physical Activity:   . Days of Exercise per Week: Not on file  . Minutes of Exercise per Session: Not on file  Stress:   . Feeling of Stress : Not on file  Social Connections:   . Frequency of Communication with Friends and Family: Not on file  . Frequency of Social Gatherings with Friends and Family: Not on file  . Attends Religious Services: Not on file  . Active Member of Clubs or Organizations: Not on file  . Attends Archivist Meetings: Not on file  . Marital Status: Not on file  Intimate Partner Violence:   . Fear of Current or Ex-Partner: Not on file  . Emotionally Abused: Not on file  . Physically Abused: Not on file  . Sexually Abused: Not on file      Review of Systems  Constitutional: Negative for chills, fatigue and unexpected weight change.  HENT: Negative for congestion, rhinorrhea, sneezing and sore throat.   Eyes: Negative for photophobia, pain and redness.  Respiratory: Negative for cough, chest tightness and shortness of breath.   Cardiovascular: Negative for chest pain and palpitations.  Gastrointestinal: Negative for abdominal pain, constipation, diarrhea, nausea and vomiting.  Endocrine: Negative.   Genitourinary: Negative for dysuria and frequency.  Musculoskeletal: Negative for arthralgias, back pain, joint swelling and neck pain.  Skin: Negative for rash.  Allergic/Immunologic: Negative.   Neurological: Negative for tremors and numbness.  Hematological:  Negative for adenopathy. Does not bruise/bleed easily.  Psychiatric/Behavioral: Negative for behavioral problems and sleep disturbance. The patient is not nervous/anxious.     Vital Signs: BP 101/60   Pulse 77   Temp (!) 97.5 F (36.4 C)   Resp 16   Ht 5\' 4"  (1.626 m)   Wt 105 lb (47.6 kg)   SpO2 100%   BMI 18.02 kg/m    Physical Exam Vitals and nursing note reviewed.  Constitutional:      General: She is not in acute distress.    Appearance: She is well-developed. She is not diaphoretic.  HENT:     Head: Normocephalic and atraumatic.     Mouth/Throat:     Pharynx: No oropharyngeal exudate.  Eyes:     Pupils: Pupils are equal, round, and reactive to light.  Neck:     Thyroid: No thyromegaly.     Vascular: No JVD.     Trachea: No tracheal deviation.  Cardiovascular:     Rate and Rhythm: Normal rate and regular rhythm.     Heart sounds: Normal heart sounds. No murmur. No friction rub. No gallop.   Pulmonary:     Effort: Pulmonary effort is normal. No respiratory distress.     Breath sounds: Normal breath sounds. No wheezing or rales.  Chest:     Chest wall: No tenderness.  Abdominal:     Palpations: Abdomen is soft.     Tenderness: There is no abdominal tenderness. There is no guarding.  Musculoskeletal:        General: Normal range of motion.     Cervical back: Normal range of motion and neck supple.  Lymphadenopathy:     Cervical: No cervical adenopathy.  Skin:    General: Skin is warm and dry.  Neurological:     Mental Status: She is alert and oriented to person, place, and time.     Cranial Nerves: No cranial nerve deficit.  Psychiatric:        Behavior: Behavior normal.        Thought Content: Thought content normal.        Judgment: Judgment normal.     Assessment/Plan: 1. Nonintractable headache, unspecified chronicity pattern, unspecified headache type Reviewed risks and possible side effects associated with taking opiates, benzodiazepines and other  CNS depressants. Combination of these could cause dizziness and drowsiness. Advised patient not to drive or operate machinery when taking these medications, as patient's and other's life can be at risk and will have consequences. Patient verbalized understanding in this matter. Dependence and abuse for these drugs will be monitored closely. A Controlled substance policy and procedure is on file which allows [facility] to order a urine drug screen test at any visit. Patient understands and agrees with the plan - butalbital-aspirin-caffeine [facility]) 50-325-40 MG capsule; Take 1 capsule by mouth every 4 (four) hours as needed for headache.  Dispense: 45  capsule; Refill: 0 Pt needs labs for physical.  - CBC with Differential/Platelet - Lipid Panel With LDL/HDL Ratio - TSH - T4, free - Comprehensive metabolic panel  General Counseling: Yesli verbalizes understanding of the findings of todays visit and agrees with plan of treatment. I have discussed any further diagnostic evaluation that may be needed or ordered today. We also reviewed her medications today. she has been encouraged to call the office with any questions or concerns that should arise related to todays visit.    Orders Placed This Encounter  Procedures  . CBC with Differential/Platelet  . Lipid Panel With LDL/HDL Ratio  . TSH  . T4, free  . Comprehensive metabolic panel    Meds ordered this encounter  Medications  . butalbital-aspirin-caffeine (FIORINAL) 50-325-40 MG capsule    Sig: Take 1 capsule by mouth every 4 (four) hours as needed for headache.    Dispense:  45 capsule    Refill:  0    Time spent: 25 Minutes with 10 minutes of chart review.   This patient was seen by Orson Gear AGNP-C in Collaboration with Dr Lavera Guise as a part of collaborative care agreement     Kendell Bane AGNP-C Internal medicine

## 2019-09-20 ENCOUNTER — Encounter: Payer: 59 | Admitting: Nurse Practitioner

## 2019-10-21 ENCOUNTER — Telehealth: Payer: Self-pay

## 2019-10-21 NOTE — Telephone Encounter (Signed)
Confirmed and screened for 10-25-19 ov. 

## 2019-10-23 LAB — COMPREHENSIVE METABOLIC PANEL
ALT: 14 IU/L (ref 0–32)
AST: 19 IU/L (ref 0–40)
Albumin/Globulin Ratio: 1.6 (ref 1.2–2.2)
Albumin: 4.7 g/dL (ref 3.8–4.8)
Alkaline Phosphatase: 58 IU/L (ref 39–117)
BUN/Creatinine Ratio: 13 (ref 9–23)
BUN: 9 mg/dL (ref 6–24)
Bilirubin Total: 0.5 mg/dL (ref 0.0–1.2)
CO2: 23 mmol/L (ref 20–29)
Calcium: 9.6 mg/dL (ref 8.7–10.2)
Chloride: 100 mmol/L (ref 96–106)
Creatinine, Ser: 0.71 mg/dL (ref 0.57–1.00)
GFR calc Af Amer: 116 mL/min/{1.73_m2} (ref >59)
GFR calc non Af Amer: 101 mL/min/{1.73_m2} (ref >59)
Globulin, Total: 3 g/dL (ref 1.5–4.5)
Glucose: 79 mg/dL (ref 65–99)
Potassium: 4.5 mmol/L (ref 3.5–5.2)
Sodium: 140 mmol/L (ref 134–144)
Total Protein: 7.7 g/dL (ref 6.0–8.5)

## 2019-10-23 LAB — CBC WITH DIFFERENTIAL/PLATELET
Basophils Absolute: 0 10*3/uL (ref 0.0–0.2)
Basos: 1 %
EOS (ABSOLUTE): 0.1 10*3/uL (ref 0.0–0.4)
Eos: 2 %
Hematocrit: 39.6 % (ref 34.0–46.6)
Hemoglobin: 12.8 g/dL (ref 11.1–15.9)
Immature Grans (Abs): 0 10*3/uL (ref 0.0–0.1)
Immature Granulocytes: 0 %
Lymphocytes Absolute: 1.3 10*3/uL (ref 0.7–3.1)
Lymphs: 36 %
MCH: 27.2 pg (ref 26.6–33.0)
MCHC: 32.3 g/dL (ref 31.5–35.7)
MCV: 84 fL (ref 79–97)
Monocytes Absolute: 0.4 10*3/uL (ref 0.1–0.9)
Monocytes: 11 %
Neutrophils Absolute: 1.9 10*3/uL (ref 1.4–7.0)
Neutrophils: 50 %
Platelets: 240 10*3/uL (ref 150–450)
RBC: 4.71 x10E6/uL (ref 3.77–5.28)
RDW: 12.5 % (ref 11.7–15.4)
WBC: 3.7 10*3/uL (ref 3.4–10.8)

## 2019-10-23 LAB — LIPID PANEL WITH LDL/HDL RATIO
Cholesterol, Total: 207 mg/dL — ABNORMAL HIGH (ref 100–199)
HDL: 86 mg/dL (ref >39)
LDL Chol Calc (NIH): 109 mg/dL — ABNORMAL HIGH (ref 0–99)
LDL/HDL Ratio: 1.3 ratio (ref 0.0–3.2)
Triglycerides: 67 mg/dL (ref 0–149)
VLDL Cholesterol Cal: 12 mg/dL (ref 5–40)

## 2019-10-23 LAB — TSH: TSH: 1.28 u[IU]/mL (ref 0.450–4.500)

## 2019-10-23 LAB — T4, FREE: Free T4: 1.13 ng/dL (ref 0.82–1.77)

## 2019-10-25 ENCOUNTER — Encounter: Payer: 59 | Admitting: Nurse Practitioner

## 2019-11-24 ENCOUNTER — Telehealth: Payer: Self-pay

## 2019-11-24 NOTE — Telephone Encounter (Signed)
Confirmed and screened for 11-26-19 ov.

## 2019-11-26 ENCOUNTER — Ambulatory Visit
Admission: RE | Admit: 2019-11-26 | Discharge: 2019-11-26 | Disposition: A | Discharge status: Home / Self Care | Payer: 59 | Attending: Nurse Practitioner | Admitting: Nurse Practitioner

## 2019-11-26 ENCOUNTER — Ambulatory Visit (INDEPENDENT_AMBULATORY_CARE_PROVIDER_SITE_OTHER): Payer: 59 | Admitting: Nurse Practitioner

## 2019-11-26 ENCOUNTER — Ambulatory Visit
Admission: RE | Admit: 2019-11-26 | Discharge: 2019-11-26 | Disposition: A | Discharge status: Home / Self Care | Payer: 59 | Source: Ambulatory Visit | Attending: Nurse Practitioner | Admitting: Nurse Practitioner

## 2019-11-26 ENCOUNTER — Encounter: Payer: Self-pay | Admitting: Nurse Practitioner

## 2019-11-26 ENCOUNTER — Other Ambulatory Visit: Payer: Self-pay

## 2019-11-26 VITALS — BP 106/58 | HR 80 | Temp 97.6°F | Resp 16 | Ht 64.0 in | Wt 106.8 lb

## 2019-11-26 DIAGNOSIS — G8929 Other chronic pain: Secondary | ICD-10-CM

## 2019-11-26 DIAGNOSIS — M5441 Lumbago with sciatica, right side: Secondary | ICD-10-CM | POA: Insufficient documentation

## 2019-11-26 DIAGNOSIS — R300 Dysuria: Secondary | ICD-10-CM | POA: Diagnosis not present

## 2019-11-26 DIAGNOSIS — Z0001 Encounter for general adult medical examination with abnormal findings: Secondary | ICD-10-CM | POA: Insufficient documentation

## 2019-11-26 DIAGNOSIS — M549 Dorsalgia, unspecified: Secondary | ICD-10-CM | POA: Insufficient documentation

## 2019-11-26 DIAGNOSIS — Z1231 Encounter for screening mammogram for malignant neoplasm of breast: Secondary | ICD-10-CM | POA: Diagnosis not present

## 2019-11-26 MED ORDER — MELOXICAM 7.5 MG PO TABS: 7.5000 mg | ORAL_TABLET | Freq: Every day | ORAL | 3 refills | Status: DC

## 2019-11-26 NOTE — Progress Notes (Signed)
[facility] [place], [place] [address]  Internal MEDICINE  Office Visit Note  Patient Name: Denise Logan  [id number]  [id number]  Date of Service: 11/26/2019  Chief Complaint  Patient presents with  . Annual Exam     The patient is here for health maintenance exam. Today, she states she has some intermittent right hip pain. She states that she fell at home about five years ago. She states that the handle of compressor hit into her hip. She never was seen about this. States that now, when it is raining, or she is active, the right lower back and hip hurt. This radiates down the front of the leg and sometimes, this radiates into the toes. She has been using OTC topical preparations when it hurts which do help some. She is curious if she had fracture the hip or tore tendon during this injury so many years ago.   Pt is here for routine health maintenance examination  Current Medication: Outpatient Encounter Medications as of 11/26/2019  Medication Sig  . butalbital-aspirin-caffeine (FIORINAL) 50-325-40 MG capsule Take 1 capsule by mouth every 4 (four) hours as needed for headache.  . meloxicam (MOBIC) 7.5 MG tablet Take 1 tablet (7.5 mg total) by mouth daily.   No facility-administered encounter medications on file as of 11/26/2019.    Surgical History: Past Surgical History:  Procedure Laterality Date  . EYE SURGERY Bilateral     Medical History: Past Medical History:  Diagnosis Date  . Cyst of buttocks 2014    Family History: Family History  Problem Relation Age of Onset  . Breast cancer Mother 28      Review of Systems  Constitutional: Negative for activity change, chills, fatigue and unexpected weight change.  HENT: Negative for congestion, postnasal drip, rhinorrhea, sneezing and sore throat.   Respiratory: Negative for cough, chest tightness, shortness of breath and wheezing.   Cardiovascular: Negative for chest pain and palpitations.   Gastrointestinal: Negative for abdominal pain, constipation, diarrhea, nausea and vomiting.  Endocrine: Negative for cold intolerance, heat intolerance, polydipsia and polyuria.  Genitourinary: Negative for dysuria, frequency and menstrual problem.  Musculoskeletal: Positive for arthralgias and myalgias. Negative for back pain, joint swelling and neck pain.       Right lower back pain which radiates into the right hip and down the anterior aspect of the right leg.   Skin: Negative for rash.  Allergic/Immunologic: Negative for environmental allergies.  Neurological: Negative for dizziness, tremors, numbness and headaches.  Hematological: Negative for adenopathy. Does not bruise/bleed easily.  Psychiatric/Behavioral: Negative for behavioral problems (Depression), sleep disturbance and suicidal ideas. The patient is not nervous/anxious.     Today's Vitals   11/26/19 0831  BP: (!) 106/58  Pulse: 80  Resp: 16  Temp: 97.6 F (36.4 C)  SpO2: 99%  Weight: 106 lb 12.8 oz (48.4 kg)  Height: 5' 4"  (1.626 m)   Body mass index is 18.33 kg/m.  Physical Exam Vitals and nursing note reviewed.  Constitutional:      General: She is not in acute distress.    Appearance: Normal appearance. She is well-developed. She is not diaphoretic.  HENT:     Head: Normocephalic and atraumatic.     Nose: Nose normal.     Mouth/Throat:     Pharynx: No oropharyngeal exudate.  Eyes:     Pupils: Pupils are equal, round, and reactive to light.  Neck:     Thyroid: No thyromegaly.     Vascular:  No JVD.     Trachea: No tracheal deviation.  Cardiovascular:     Rate and Rhythm: Normal rate and regular rhythm.     Pulses: Normal pulses.     Heart sounds: Normal heart sounds. No murmur. No friction rub. No gallop.   Pulmonary:     Effort: Pulmonary effort is normal. No respiratory distress.     Breath sounds: Normal breath sounds. No wheezing or rales.  Chest:     Chest wall: No tenderness.     Breasts:         Right: Normal. No swelling, bleeding, inverted nipple, mass, nipple discharge or tenderness.        Left: Normal. No swelling, bleeding, inverted nipple, mass, nipple discharge, skin change or tenderness.  Abdominal:     General: Bowel sounds are normal.     Palpations: Abdomen is soft.     Tenderness: There is no abdominal tenderness.  Musculoskeletal:        General: Tenderness present. Normal range of motion.     Cervical back: Normal range of motion and neck supple.     Comments: Tenderness with moderate palpation of the right posterior hip and right lower back. No abnormalities or deformities are noted at this time.   Lymphadenopathy:     Cervical: No cervical adenopathy.     Upper Body:     Right upper body: No axillary adenopathy.     Left upper body: No axillary adenopathy.  Skin:    General: Skin is warm and dry.  Neurological:     Mental Status: She is alert and oriented to person, place, and time.     Cranial Nerves: No cranial nerve deficit.  Psychiatric:        Mood and Affect: Mood normal.        Behavior: Behavior normal.        Thought Content: Thought content normal.        Judgment: Judgment normal.      LABS: Recent Results (from the past 2160 hour(s))  CBC with Differential/Platelet     Status: None   Collection Time: 10/22/19 10:13 AM  Result Value Ref Range   WBC 3.7 3.4 - 10.8 x10E3/uL   RBC 4.71 3.77 - 5.28 x10E6/uL   Hemoglobin 12.8 11.1 - 15.9 g/dL   Hematocrit 39.6 34.0 - 46.6 %   MCV 84 79 - 97 fL   MCH 27.2 26.6 - 33.0 pg   MCHC 32.3 31.5 - 35.7 g/dL   RDW 12.5 11.7 - 15.4 %   Platelets 240 150 - 450 x10E3/uL   Neutrophils 50 Not Estab. %   Lymphs 36 Not Estab. %   Monocytes 11 Not Estab. %   Eos 2 Not Estab. %   Basos 1 Not Estab. %   Neutrophils Absolute 1.9 1.4 - 7.0 x10E3/uL   Lymphocytes Absolute 1.3 0.7 - 3.1 x10E3/uL   Monocytes Absolute 0.4 0.1 - 0.9 x10E3/uL   EOS (ABSOLUTE) 0.1 0.0 - 0.4 x10E3/uL   Basophils Absolute 0.0  0.0 - 0.2 x10E3/uL   Immature Granulocytes 0 Not Estab. %   Immature Grans (Abs) 0.0 0.0 - 0.1 x10E3/uL  Lipid Panel With LDL/HDL Ratio     Status: Abnormal   Collection Time: 10/22/19 10:13 AM  Result Value Ref Range   Cholesterol, Total 207 (H) 100 - 199 mg/dL   Triglycerides 67 0 - 149 mg/dL   HDL 86 >39 mg/dL   VLDL Cholesterol Cal 12 5 -  40 mg/dL   LDL Chol Calc (NIH) 109 (H) 0 - 99 mg/dL   LDL/HDL Ratio 1.3 0.0 - 3.2 ratio    Comment:                                     LDL/HDL Ratio                                             Men  Women                               1/2 Avg.Risk  1.0    1.5                                   Avg.Risk  3.6    3.2                                2X Avg.Risk  6.2    5.0                                3X Avg.Risk  8.0    6.1   TSH     Status: None   Collection Time: 10/22/19 10:13 AM  Result Value Ref Range   TSH 1.280 0.450 - 4.500 uIU/mL  T4, free     Status: None   Collection Time: 10/22/19 10:13 AM  Result Value Ref Range   Free T4 1.13 0.82 - 1.77 ng/dL  Comprehensive metabolic panel     Status: None   Collection Time: 10/22/19 10:13 AM  Result Value Ref Range   Glucose 79 65 - 99 mg/dL   BUN 9 6 - 24 mg/dL   Creatinine, Ser 0.71 0.57 - 1.00 mg/dL   GFR calc non Af Amer 101 >59 mL/min/1.73   GFR calc Af Amer 116 >59 mL/min/1.73    Comment: **[facility] currently reports eGFR in compliance with the current**   recommendations of the [employer]. [facility] will   update reporting as new guidelines are published from the NKF-ASN   Task force.    BUN/Creatinine Ratio 13 9 - 23   Sodium 140 134 - 144 mmol/L   Potassium 4.5 3.5 - 5.2 mmol/L   Chloride 100 96 - 106 mmol/L   CO2 23 20 - 29 mmol/L   Calcium 9.6 8.7 - 10.2 mg/dL   Total Protein 7.7 6.0 - 8.5 g/dL   Albumin 4.7 3.8 - 4.8 g/dL   Globulin, Total 3.0 1.5 - 4.5 g/dL   Albumin/Globulin Ratio 1.6 1.2 - 2.2   Bilirubin Total 0.5 0.0 - 1.2 mg/dL   Alkaline Phosphatase  58 39 - 117 IU/L   AST 19 0 - 40 IU/L   ALT 14 0 - 32 IU/L    Assessment/Plan: 1. Encounter for general adult medical examination with abnormal findings Annual health maintenance exam today.   2. Chronic right-sided low back pain with right-sided sciatica Start meloxicam 7.10m tablets daily as needed for pain/inflammation. Will x-ray the lumbar spine and right hip for further  evaluation.  Refer to orthopedics as indicated.  - DG Lumbar Spine Complete; Future - meloxicam (MOBIC) 7.5 MG tablet; Take 1 tablet (7.5 mg total) by mouth daily.  Dispense: 30 tablet; Refill: 3 - DG HIP UNILAT WITH PELVIS 2-3 VIEWS RIGHT; Future  3. Encounter for screening mammogram for malignant neoplasm of breast - MM DIGITAL SCREENING BILATERAL; Future  4. Dysuria - Urinalysis, Routine w reflex microscopic   General Counseling: Rhyli verbalizes understanding of the findings of todays visit and agrees with plan of treatment. I have discussed any further diagnostic evaluation that may be needed or ordered today. We also reviewed her medications today. she has been encouraged to call the office with any questions or concerns that should arise related to todays visit.    Counseling:  This patient was seen by Leretha Pol FNP Collaboration with Dr Lavera Guise as a part of collaborative care agreement  Orders Placed This Encounter  Procedures  . DG Lumbar Spine Complete  . MM DIGITAL SCREENING BILATERAL  . DG HIP UNILAT WITH PELVIS 2-3 VIEWS RIGHT  . Urinalysis, Routine w reflex microscopic    Meds ordered this encounter  Medications  . meloxicam (MOBIC) 7.5 MG tablet    Sig: Take 1 tablet (7.5 mg total) by mouth daily.    Dispense:  30 tablet    Refill:  3    Order Specific Question:   Supervising Provider    Answer:   Lavera Guise [[id number]]    Total time spent: 51 Minutes  Time spent includes review of chart, medications, test results, and follow up plan with the patient.     Lavera Guise,  MD  Internal Medicine

## 2019-11-26 NOTE — Patient Instructions (Signed)
Sciatica Rehab Ask your health care provider which exercises are safe for you. Do exercises exactly as told by your health care provider and adjust them as directed. It is normal to feel mild stretching, pulling, tightness, or discomfort as you do these exercises. Stop right away if you feel sudden pain or your pain gets worse. Do not begin these exercises until told by your health care provider. Stretching and range-of-motion exercises These exercises warm up your muscles and joints and improve the movement and flexibility of your hips and back. These exercises also help to relieve pain, numbness, and tingling. Sciatic nerve glide 1. Sit in a chair with your head facing down toward your chest. Place your hands behind your back. Let your shoulders slump forward. 2. Slowly straighten one of your legs while you tilt your head back as if you are looking toward the ceiling. Only straighten your leg as far as you can without making your symptoms worse. 3. Hold this position for __________ seconds. 4. Slowly return to the starting position. 5. Repeat with your other leg. Repeat __________ times. Complete this exercise __________ times a day. Knee to chest with hip adduction and internal rotation  1. Lie on your back on a firm surface with both legs straight. 2. Bend one of your knees and move it up toward your chest until you feel a gentle stretch in your lower back and buttock. Then, move your knee toward the shoulder that is on the opposite side from your leg. This is hip adduction and internal rotation. ? Hold your leg in this position by holding on to the front of your knee. 3. Hold this position for __________ seconds. 4. Slowly return to the starting position. 5. Repeat with your other leg. Repeat __________ times. Complete this exercise __________ times a day. Prone extension on elbows  1. Lie on your abdomen on a firm surface. A bed may be too soft for this exercise. 2. Prop yourself up on  your elbows. 3. Use your arms to help lift your chest up until you feel a gentle stretch in your abdomen and your lower back. ? This will place some of your body weight on your elbows. If this is uncomfortable, try stacking pillows under your chest. ? Your hips should stay down, against the surface that you are lying on. Keep your hip and back muscles relaxed. 4. Hold this position for __________ seconds. 5. Slowly relax your upper body and return to the starting position. Repeat __________ times. Complete this exercise __________ times a day. Strengthening exercises These exercises build strength and endurance in your back. Endurance is the ability to use your muscles for a long time, even after they get tired. Pelvic tilt This exercise strengthens the muscles that lie deep in the abdomen. 1. Lie on your back on a firm surface. Bend your knees and keep your feet flat on the floor. 2. Tense your abdominal muscles. Tip your pelvis up toward the ceiling and flatten your lower back into the floor. ? To help with this exercise, you may place a small towel under your lower back and try to push your back into the towel. 3. Hold this position for __________ seconds. 4. Let your muscles relax completely before you repeat this exercise. Repeat __________ times. Complete this exercise __________ times a day. Alternating arm and leg raises  1. Get on your hands and knees on a firm surface. If you are on a hard floor, you may want to use   padding, such as an exercise mat, to cushion your knees. 2. Line up your arms and legs. Your hands should be directly below your shoulders, and your knees should be directly below your hips. 3. Lift your left leg behind you. At the same time, raise your right arm and straighten it in front of you. ? Do not lift your leg higher than your hip. ? Do not lift your arm higher than your shoulder. ? Keep your abdominal and back muscles tight. ? Keep your hips facing the  ground. ? Do not arch your back. ? Keep your balance carefully, and do not hold your breath. 4. Hold this position for __________ seconds. 5. Slowly return to the starting position. 6. Repeat with your right leg and your left arm. Repeat __________ times. Complete this exercise __________ times a day. Posture and body mechanics Good posture and healthy body mechanics can help to relieve stress in your body's tissues and joints. Body mechanics refers to the movements and positions of your body while you do your daily activities. Posture is part of body mechanics. Good posture means:  Your spine is in its natural S-curve position (neutral).  Your shoulders are pulled back slightly.  Your head is not tipped forward. Follow these guidelines to improve your posture and body mechanics in your everyday activities. Standing   When standing, keep your spine neutral and your feet about hip width apart. Keep a slight bend in your knees. Your ears, shoulders, and hips should line up.  When you do a task in which you stand in one place for a long time, place one foot up on a stable object that is 2-4 inches (5-10 cm) high, such as a footstool. This helps keep your spine neutral. Sitting   When sitting, keep your spine neutral and keep your feet flat on the floor. Use a footrest, if necessary, and keep your thighs parallel to the floor. Avoid rounding your shoulders, and avoid tilting your head forward.  When working at a desk or a computer, keep your desk at a height where your hands are slightly lower than your elbows. Slide your chair under your desk so you are close enough to maintain good posture.  When working at a computer, place your monitor at a height where you are looking straight ahead and you do not have to tilt your head forward or downward to look at the screen. Resting  When lying down and resting, avoid positions that are most painful for you.  If you have pain with activities  such as sitting, bending, stooping, or squatting, lie in a position in which your body does not bend very much. For example, avoid curling up on your side with your arms and knees near your chest (fetal position).  If you have pain with activities such as standing for a long time or reaching with your arms, lie with your spine in a neutral position and bend your knees slightly. Try the following positions: ? Lying on your side with a pillow between your knees. ? Lying on your back with a pillow under your knees. Lifting   When lifting objects, keep your feet at least shoulder width apart and tighten your abdominal muscles.  Bend your knees and hips and keep your spine neutral. It is important to lift using the strength of your legs, not your back. Do not lock your knees straight out.  Always ask for help to lift heavy or awkward objects. This information is not   intended to replace advice given to you by your health care provider. Make sure you discuss any questions you have with your health care provider. Document Revised: 10/09/2018 Document Reviewed: 07/09/2018 Elsevier Patient Education  2020 [employer]  

## 2019-11-27 LAB — URINALYSIS, ROUTINE W REFLEX MICROSCOPIC
Bilirubin, UA: NEGATIVE
Glucose, UA: NEGATIVE
Nitrite, UA: NEGATIVE
Protein,UA: NEGATIVE
RBC, UA: NEGATIVE
Specific Gravity, UA: 1.023 (ref 1.005–1.030)
Urobilinogen, Ur: 0.2 mg/dL (ref 0.2–1.0)
pH, UA: 5 (ref 5.0–7.5)

## 2019-11-27 LAB — MICROSCOPIC EXAMINATION
Bacteria, UA: NONE SEEN
Casts: NONE SEEN /lpf

## 2019-12-01 NOTE — Progress Notes (Signed)
Normal x-ray of right hip

## 2019-12-01 NOTE — Progress Notes (Signed)
Please let the patient know that z-ray of the spine and right hip were both normal. thanks

## 2019-12-03 ENCOUNTER — Telehealth: Payer: Self-pay

## 2019-12-03 NOTE — Telephone Encounter (Signed)
Patient was notified.

## 2019-12-03 NOTE — Telephone Encounter (Signed)
-----   Message from Ronnell Freshwater, NP sent at 12/01/2019  4:07 PM EDT ----- Please let the patient know that z-ray of the spine and right hip were both normal. thanks

## 2020-06-02 ENCOUNTER — Ambulatory Visit: Payer: 59 | Admitting: Nurse Practitioner

## 2020-10-26 ENCOUNTER — Encounter: Payer: Self-pay | Admitting: Physician Assistant

## 2020-10-26 ENCOUNTER — Ambulatory Visit (INDEPENDENT_AMBULATORY_CARE_PROVIDER_SITE_OTHER): Payer: 59 | Admitting: Physician Assistant

## 2020-10-26 ENCOUNTER — Other Ambulatory Visit: Payer: Self-pay

## 2020-10-26 ENCOUNTER — Other Ambulatory Visit: Payer: Self-pay | Admitting: Physician Assistant

## 2020-10-26 DIAGNOSIS — D1809 Hemangioma of other sites: Secondary | ICD-10-CM

## 2020-10-26 DIAGNOSIS — R5383 Other fatigue: Secondary | ICD-10-CM

## 2020-10-26 DIAGNOSIS — R202 Paresthesia of skin: Secondary | ICD-10-CM

## 2020-10-26 DIAGNOSIS — Z0001 Encounter for general adult medical examination with abnormal findings: Secondary | ICD-10-CM

## 2020-10-26 DIAGNOSIS — R519 Headache, unspecified: Secondary | ICD-10-CM | POA: Diagnosis not present

## 2020-10-26 DIAGNOSIS — N951 Menopausal and female climacteric states: Secondary | ICD-10-CM

## 2020-10-26 DIAGNOSIS — F320 Major depressive disorder, single episode, mild: Secondary | ICD-10-CM

## 2020-10-26 DIAGNOSIS — R300 Dysuria: Secondary | ICD-10-CM

## 2020-10-26 DIAGNOSIS — Z1231 Encounter for screening mammogram for malignant neoplasm of breast: Secondary | ICD-10-CM

## 2020-10-26 DIAGNOSIS — R200 Anesthesia of skin: Secondary | ICD-10-CM

## 2020-10-26 DIAGNOSIS — Z1212 Encounter for screening for malignant neoplasm of rectum: Secondary | ICD-10-CM

## 2020-10-26 DIAGNOSIS — G8929 Other chronic pain: Secondary | ICD-10-CM

## 2020-10-26 DIAGNOSIS — Z1211 Encounter for screening for malignant neoplasm of colon: Secondary | ICD-10-CM

## 2020-10-26 DIAGNOSIS — M542 Cervicalgia: Secondary | ICD-10-CM

## 2020-10-26 MED ORDER — BUTALBITAL-ASA-CAFFEINE 50-325-40 MG PO CAPS: 1.0000 | ORAL_CAPSULE | ORAL | 0 refills | Status: DC | PRN

## 2020-10-26 MED ORDER — MELOXICAM 7.5 MG PO TABS: 7.5000 mg | ORAL_TABLET | Freq: Every day | ORAL | 3 refills | Status: DC

## 2020-10-26 MED ORDER — ESCITALOPRAM OXALATE 5 MG PO TABS: 5.0000 mg | ORAL_TABLET | Freq: Every day | ORAL | 2 refills | Status: DC

## 2020-10-26 NOTE — Progress Notes (Signed)
[facility] [place], [place] [address]  Internal MEDICINE  Office Visit Note  Patient Name: Denise Logan  [id number]  [id number]  Date of Service: 10/29/2020  Chief Complaint  Patient presents with  . Annual Exam    Headaches, hand numbness on left hand, sometimes feels like the bone in her forearm hurts on left side, discuss menopause, discuss depression  . Quality Metric Gaps    Colonoscopy, pap     HPI Pt is here for routine health maintenance examination -Going through menopause, has headaches off an on for week at a time and experiencing hot flashes. Last cycle March 17, and have been irregular for awhile. -Takes fiorinal if headache gets really bad or tylenol not working. -Does report recent feelings of depression.  She is wondering if this is related to menopause/hormonal changes.  Patient is interested in checking hormone levels and/or beginning treatment. -Patient denies wanting Pap performed today.  She is willing to be referred for colonoscopy and orders placed for mammogram. -Patient also has a lesion on forehead that she would like removed.  Lesion is small and appears consistent with a small hemangioma.  Patient would like to be referred to dermatology for possible excision. -Patient also reports that she will get some numbness in her left hand and along left forearm.  This comes and goes.  She does have lots of tension in shoulders that may be contributing.  Current Medication: Outpatient Encounter Medications as of 10/26/2020  Medication Sig  . escitalopram (LEXAPRO) 5 MG tablet Take 1 tablet (5 mg total) by mouth daily.  . [DISCONTINUED] butalbital-aspirin-caffeine (FIORINAL) 50-325-40 MG capsule Take 1 capsule by mouth every 4 (four) hours as needed for headache.  . [DISCONTINUED] meloxicam (MOBIC) 7.5 MG tablet Take 1 tablet (7.5 mg total) by mouth daily.  . butalbital-aspirin-caffeine (FIORINAL) 50-325-40 MG capsule Take 1 capsule by mouth  every 4 (four) hours as needed for headache.  . meloxicam (MOBIC) 7.5 MG tablet Take 1 tablet (7.5 mg total) by mouth daily.   No facility-administered encounter medications on file as of 10/26/2020.    Surgical History: Past Surgical History:  Procedure Laterality Date  . EYE SURGERY Bilateral     Medical History: Past Medical History:  Diagnosis Date  . Cyst of buttocks 2014    Family History: Family History  Problem Relation Age of Onset  . Breast cancer Mother 59  . Hypertension Mother   . Hyperlipidemia Mother   . Hypertension Father   . Thyroid disease Sister       Review of Systems  Constitutional: Positive for fatigue. Negative for chills and unexpected weight change.  HENT: Negative for congestion, postnasal drip, rhinorrhea, sneezing and sore throat.   Eyes: Negative for redness.  Respiratory: Negative for cough, chest tightness and shortness of breath.   Cardiovascular: Negative for chest pain and palpitations.  Gastrointestinal: Negative for abdominal pain, constipation, diarrhea, nausea and vomiting.  Endocrine:       Hot flashes  Genitourinary: Positive for menstrual problem. Negative for dysuria and frequency.  Musculoskeletal: Positive for myalgias. Negative for arthralgias, back pain, joint swelling and neck pain.  Skin: Negative for rash.       Small lesion on forehead she wants removed  Neurological: Positive for numbness and headaches. Negative for tremors.  Hematological: Negative for adenopathy. Does not bruise/bleed easily.  Psychiatric/Behavioral: Positive for behavioral problems (Depression). Negative for sleep disturbance and suicidal ideas. The patient is not nervous/anxious.  Vital Signs: BP 110/72   Pulse 85   Temp 97.6 F (36.4 C)   Resp 16   Ht 5\' 4"  (1.626 m)   Wt 121 lb 3.2 oz (55 kg)   SpO2 99%   BMI 20.80 kg/m    Physical Exam Vitals and nursing note reviewed.  Constitutional:      General: She is not in acute  distress.    Appearance: She is well-developed and normal weight. She is not diaphoretic.  HENT:     Head: Normocephalic and atraumatic.     Right Ear: External ear normal.     Left Ear: External ear normal.     Nose: Nose normal.     Mouth/Throat:     Pharynx: No oropharyngeal exudate.  Eyes:     General: No scleral icterus.       Right eye: No discharge.        Left eye: No discharge.     Conjunctiva/sclera: Conjunctivae normal.     Pupils: Pupils are equal, round, and reactive to light.  Neck:     Thyroid: No thyromegaly.     Vascular: No JVD.     Trachea: No tracheal deviation.  Cardiovascular:     Rate and Rhythm: Normal rate and regular rhythm.     Heart sounds: Normal heart sounds. No murmur heard. No friction rub. No gallop.   Pulmonary:     Effort: Pulmonary effort is normal. No respiratory distress.     Breath sounds: Normal breath sounds. No stridor. No wheezing or rales.  Chest:     Chest wall: No tenderness.  Breasts:     Right: Normal. No mass.     Left: Normal. No mass.    Abdominal:     General: Bowel sounds are normal. There is no distension.     Palpations: Abdomen is soft. There is no mass.     Tenderness: There is no abdominal tenderness. There is no guarding or rebound.  Musculoskeletal:        General: No tenderness or deformity. Normal range of motion.     Cervical back: Normal range of motion and neck supple.     Comments: Tension in shoulders/neck  Lymphadenopathy:     Cervical: No cervical adenopathy.  Skin:    General: Skin is warm and dry.     Coloration: Skin is not pale.     Findings: No erythema or rash.  Neurological:     General: No focal deficit present.     Mental Status: She is alert.     Cranial Nerves: No cranial nerve deficit.     Sensory: No sensory deficit.     Motor: No abnormal muscle tone.     Coordination: Coordination normal.     Deep Tendon Reflexes: Reflexes are normal and symmetric.  Psychiatric:        Behavior:  Behavior normal.        Thought Content: Thought content normal.        Judgment: Judgment normal.      LABS: Recent Results (from the past 2160 hour(s))  UA/M w/rflx Culture, Routine     Status: Abnormal   Collection Time: 10/26/20 11:19 AM   Specimen: Urine   Urine  Result Value Ref Range   Specific Gravity, UA 1.025 1.005 - 1.030   pH, UA 6.0 5.0 - 7.5   Color, UA Yellow Yellow   Appearance Ur Clear Clear   Leukocytes,UA Negative Negative   Protein,UA Negative  Negative/Trace   Glucose, UA Negative Negative   Ketones, UA Trace (A) Negative   RBC, UA Negative Negative   Bilirubin, UA Negative Negative   Urobilinogen, Ur 0.2 0.2 - 1.0 mg/dL   Nitrite, UA Negative Negative   Microscopic Examination Comment     Comment: Microscopic follows if indicated.   Microscopic Examination See below:     Comment: Microscopic was indicated and was performed.   Urinalysis Reflex Comment     Comment: This specimen will not reflex to a Urine Culture.  Microscopic Examination     Status: None   Collection Time: 10/26/20 11:19 AM   Urine  Result Value Ref Range   WBC, UA None seen 0 - 5 /hpf   RBC 0-2 0 - 2 /hpf   Epithelial Cells (non renal) 0-10 0 - 10 /hpf   Casts None seen None seen /lpf   Bacteria, UA Few None seen/Few        Assessment/Plan: 1. Encounter for general adult medical examination with abnormal findings Due for colonoscopy and mammogram.  Denies Pap today.  Will order routine fasting lab work  2. Depression, major, single episode, mild (HCC) Will start on Lexapro 5 mg, may need to titrate as needed - escitalopram (LEXAPRO) 5 MG tablet; Take 1 tablet (5 mg total) by mouth daily.  Dispense: 30 tablet; Refill: 2  3. Menopausal symptoms Will check hormone levels, patient may be candidate for HRT if symptoms continue/progress - FSH/LH  4. Nonintractable headache, unspecified chronicity pattern, unspecified headache type History of headaches, may use Fiorinal as  needed for severe headache - butalbital-aspirin-caffeine (FIORINAL) 50-325-40 MG capsule; Take 1 capsule by mouth every 4 (four) hours as needed for headache.  Dispense: 45 capsule; Refill: 0  5. Acute neck pain May take Mobic to help with neck and arm pain.  Patient also encouraged to use heating pads and stretching exercises to help with muscle tension - meloxicam (MOBIC) 7.5 MG tablet; Take 1 tablet (7.5 mg total) by mouth daily.  Dispense: 30 tablet; Refill: 3  6. Numbness and tingling in left arm May take Mobic, use heating pad, perform stretching exercises to help reduce muscle tension and inflammation that may be contributing to numbness.  Also recommended patient try nightly wrist splints. - meloxicam (MOBIC) 7.5 MG tablet; Take 1 tablet (7.5 mg total) by mouth daily.  Dispense: 30 tablet; Refill: 3  7. Hemangioma of face Dermatology referral sent for possible excision of facial lesion  8. Encounter for screening mammogram for malignant neoplasm of breast - MM DIGITAL SCREENING BILATERAL  9. Screening for colorectal cancer - Ambulatory referral to Gastroenterology  10. Other fatigue - CBC w/Diff/Platelet - Comprehensive metabolic panel - Lipid Panel With LDL/HDL Ratio - TSH + free T4 - FSH/LH - Ambulatory referral to Dermatology  11. Dysuria - UA/M w/rflx Culture, Routine - Microscopic Examination   General Counseling: Denise Logan verbalizes understanding of the findings of todays visit and agrees with plan of treatment. I have discussed any further diagnostic evaluation that may be needed or ordered today. We also reviewed her medications today. she has been encouraged to call the office with any questions or concerns that should arise related to todays visit.    Counseling:    Orders Placed This Encounter  Procedures  . Microscopic Examination  . MM DIGITAL SCREENING BILATERAL  . UA/M w/rflx Culture, Routine  . CBC w/Diff/Platelet  . Comprehensive metabolic panel  .  Lipid Panel With LDL/HDL Ratio  . TSH +  free T4  . FSH/LH  . Ambulatory referral to Gastroenterology  . Ambulatory referral to Dermatology    Meds ordered this encounter  Medications  . butalbital-aspirin-caffeine (FIORINAL) 50-325-40 MG capsule    Sig: Take 1 capsule by mouth every 4 (four) hours as needed for headache.    Dispense:  45 capsule    Refill:  0  . meloxicam (MOBIC) 7.5 MG tablet    Sig: Take 1 tablet (7.5 mg total) by mouth daily.    Dispense:  30 tablet    Refill:  3  . escitalopram (LEXAPRO) 5 MG tablet    Sig: Take 1 tablet (5 mg total) by mouth daily.    Dispense:  30 tablet    Refill:  2    This patient was seen by Drema Dallas, PA-C in collaboration with Dr. Clayborn Bigness as a part of collaborative care agreement.  Total time spent:40 Minutes  Time spent includes review of chart, medications, test results, and follow up plan with the patient.     Lavera Guise, MD  Internal Medicine

## 2020-10-27 LAB — COMPREHENSIVE METABOLIC PANEL
ALT: 11 IU/L (ref 0–32)
AST: 19 IU/L (ref 0–40)
Albumin/Globulin Ratio: 1.6 (ref 1.2–2.2)
Albumin: 4.7 g/dL (ref 3.8–4.8)
Alkaline Phosphatase: 59 IU/L (ref 44–121)
BUN/Creatinine Ratio: 14 (ref 9–23)
BUN: 10 mg/dL (ref 6–24)
Bilirubin Total: 0.4 mg/dL (ref 0.0–1.2)
CO2: 24 mmol/L (ref 20–29)
Calcium: 9.4 mg/dL (ref 8.7–10.2)
Chloride: 97 mmol/L (ref 96–106)
Creatinine, Ser: 0.69 mg/dL (ref 0.57–1.00)
Globulin, Total: 3 g/dL (ref 1.5–4.5)
Glucose: 78 mg/dL (ref 65–99)
Potassium: 4.1 mmol/L (ref 3.5–5.2)
Sodium: 136 mmol/L (ref 134–144)
Total Protein: 7.7 g/dL (ref 6.0–8.5)
eGFR: 106 mL/min/{1.73_m2} (ref >59)

## 2020-10-27 LAB — CBC WITH DIFFERENTIAL/PLATELET
Basophils Absolute: 0 10*3/uL (ref 0.0–0.2)
Basos: 1 %
EOS (ABSOLUTE): 0 10*3/uL (ref 0.0–0.4)
Eos: 1 %
Hematocrit: 40 % (ref 34.0–46.6)
Hemoglobin: 12.7 g/dL (ref 11.1–15.9)
Immature Grans (Abs): 0 10*3/uL (ref 0.0–0.1)
Immature Granulocytes: 0 %
Lymphocytes Absolute: 1.3 10*3/uL (ref 0.7–3.1)
Lymphs: 28 %
MCH: 26.8 pg (ref 26.6–33.0)
MCHC: 31.8 g/dL (ref 31.5–35.7)
MCV: 84 fL (ref 79–97)
Monocytes Absolute: 0.4 10*3/uL (ref 0.1–0.9)
Monocytes: 10 %
Neutrophils Absolute: 2.7 10*3/uL (ref 1.4–7.0)
Neutrophils: 60 %
Platelets: 280 10*3/uL (ref 150–450)
RBC: 4.74 x10E6/uL (ref 3.77–5.28)
RDW: 12.9 % (ref 11.7–15.4)
WBC: 4.5 10*3/uL (ref 3.4–10.8)

## 2020-10-27 LAB — FSH/LH
FSH: 33.4 m[IU]/mL
LH: 37.7 m[IU]/mL

## 2020-10-27 LAB — MICROSCOPIC EXAMINATION
Casts: NONE SEEN /lpf
WBC, UA: NONE SEEN /hpf (ref 0–5)

## 2020-10-27 LAB — UA/M W/RFLX CULTURE, ROUTINE
Bilirubin, UA: NEGATIVE
Glucose, UA: NEGATIVE
Leukocytes,UA: NEGATIVE
Nitrite, UA: NEGATIVE
Protein,UA: NEGATIVE
RBC, UA: NEGATIVE
Specific Gravity, UA: 1.025 (ref 1.005–1.030)
Urobilinogen, Ur: 0.2 mg/dL (ref 0.2–1.0)
pH, UA: 6 (ref 5.0–7.5)

## 2020-10-27 LAB — LIPID PANEL WITH LDL/HDL RATIO
Cholesterol, Total: 237 mg/dL — ABNORMAL HIGH (ref 100–199)
HDL: 94 mg/dL (ref >39)
LDL Chol Calc (NIH): 134 mg/dL — ABNORMAL HIGH (ref 0–99)
LDL/HDL Ratio: 1.4 ratio (ref 0.0–3.2)
Triglycerides: 54 mg/dL (ref 0–149)
VLDL Cholesterol Cal: 9 mg/dL (ref 5–40)

## 2020-10-27 LAB — TSH+FREE T4
Free T4: 1.13 ng/dL (ref 0.82–1.77)
TSH: 1.16 u[IU]/mL (ref 0.450–4.500)

## 2020-10-31 ENCOUNTER — Other Ambulatory Visit: Payer: Self-pay | Admitting: Physician Assistant

## 2020-10-31 DIAGNOSIS — Z1231 Encounter for screening mammogram for malignant neoplasm of breast: Secondary | ICD-10-CM

## 2020-11-10 ENCOUNTER — Telehealth (INDEPENDENT_AMBULATORY_CARE_PROVIDER_SITE_OTHER): Payer: Self-pay | Admitting: Gastroenterology

## 2020-11-10 DIAGNOSIS — Z1211 Encounter for screening for malignant neoplasm of colon: Secondary | ICD-10-CM

## 2020-11-10 MED ORDER — PEG 3350-KCL-NA BICARB-NACL 420 G PO SOLR: 4000.0000 mL | Freq: Once | ORAL | 0 refills | Status: AC

## 2020-11-10 NOTE — Progress Notes (Signed)
Gastroenterology Pre-Procedure Review  Request Date: 12/15/2020 Requesting Physician: Dr. Bonna Gains   PATIENT REVIEW QUESTIONS: The patient responded to the following health history questions as indicated:    1. Are you having any GI issues? no 2. Do you have a personal history of Polyps? no 3. Do you have a family history of Colon Cancer or Polyps? No 4. Diabetes Mellitus? no 5. Joint replacements in the past 12 months?no 6. Major health problems in the past 3 months?no 7. Any artificial heart valves, MVP, or defibrillator?no    MEDICATIONS & ALLERGIES:    Patient reports the following regarding taking any anticoagulation/antiplatelet therapy:   Plavix, Coumadin, Eliquis, Xarelto, Lovenox, Pradaxa, Brilinta, or Effient? no Aspirin? no  Patient confirms/reports the following medications:  Current Outpatient Medications  Medication Sig Dispense Refill  . butalbital-aspirin-caffeine (FIORINAL) 50-325-40 MG capsule Take 1 capsule by mouth every 4 (four) hours as needed for headache. 45 capsule 0  . escitalopram (LEXAPRO) 5 MG tablet Take 1 tablet (5 mg total) by mouth daily. 30 tablet 2  . meloxicam (MOBIC) 7.5 MG tablet Take 1 tablet (7.5 mg total) by mouth daily. 30 tablet 3  . polyethylene glycol-electrolytes (GAVILYTE-N WITH FLAVOR PACK) 420 g solution Take 4,000 mLs by mouth once for 1 dose. 4000 mL 0   No current facility-administered medications for this visit.    Patient confirms/reports the following allergies:  Allergies  Allergen Reactions  . Codeine Other (See Comments)    headache    No orders of the defined types were placed in this encounter.   AUTHORIZATION INFORMATION Primary Insurance: 1D#: Group #:  Secondary Insurance: 1D#: Group #:  SCHEDULE INFORMATION: Date: 12/15/2020 Time: Location: [place]

## 2020-11-23 ENCOUNTER — Ambulatory Visit: Payer: 59 | Admitting: Physician Assistant

## 2020-11-23 ENCOUNTER — Encounter: Payer: Self-pay | Admitting: Physician Assistant

## 2020-11-23 ENCOUNTER — Other Ambulatory Visit: Payer: Self-pay

## 2020-11-23 DIAGNOSIS — R519 Headache, unspecified: Secondary | ICD-10-CM | POA: Diagnosis not present

## 2020-11-23 DIAGNOSIS — M5441 Lumbago with sciatica, right side: Secondary | ICD-10-CM

## 2020-11-23 DIAGNOSIS — N951 Menopausal and female climacteric states: Secondary | ICD-10-CM

## 2020-11-23 DIAGNOSIS — F320 Major depressive disorder, single episode, mild: Secondary | ICD-10-CM | POA: Diagnosis not present

## 2020-11-23 DIAGNOSIS — E7800 Pure hypercholesterolemia, unspecified: Secondary | ICD-10-CM

## 2020-11-23 DIAGNOSIS — G8929 Other chronic pain: Secondary | ICD-10-CM

## 2020-11-23 MED ORDER — DULOXETINE HCL 20 MG PO CPEP: 20.0000 mg | ORAL_CAPSULE | Freq: Every day | ORAL | 3 refills | Status: DC

## 2020-11-23 NOTE — Progress Notes (Signed)
[facility] [place], [place] [address]  Internal MEDICINE  Office Visit Note  Patient Name: Denise Logan  [id number]  [id number]  Date of Service: 11/23/2020  Chief Complaint  Patient presents with  . Follow-up    Review labs, discuss menopause  . Quality Metric Gaps    Pap, colonoscopy scheduled    HPI Pt is here for routine follow up. -Mobic and lexapro started at the same time and felt sick after 10 days stopped both bc unsure which made her sick. Is willing to retry but also feels better now. Not feeling symptoms of depression recently and thinks the mood fluctuations are more tied to irregular cycles. -Periods are irregular. Last cycle was 14-15th of May. Is entering menopause based on labs. Has had a hot flash a few months ago. -Still has some aches especially in low back and right hip/leg. -Does sometimes have pain lower in legs and feet and wears compression stockings at times. Will perform ABI at next visit. -Discussed labs show elevated cholesterol  Current Medication: Outpatient Encounter Medications as of 11/23/2020  Medication Sig  . butalbital-aspirin-caffeine (FIORINAL) 50-325-40 MG capsule Take 1 capsule by mouth every 4 (four) hours as needed for headache.  . DULoxetine (CYMBALTA) 20 MG capsule Take 1 capsule (20 mg total) by mouth daily.  . meloxicam (MOBIC) 7.5 MG tablet Take 1 tablet (7.5 mg total) by mouth daily. (Patient not taking: Reported on 11/23/2020)  . [DISCONTINUED] escitalopram (LEXAPRO) 5 MG tablet Take 1 tablet (5 mg total) by mouth daily. (Patient not taking: Reported on 11/23/2020)   No facility-administered encounter medications on file as of 11/23/2020.    Surgical History: Past Surgical History:  Procedure Laterality Date  . EYE SURGERY Bilateral     Medical History: Past Medical History:  Diagnosis Date  . Cyst of buttocks 2014    Family History: Family History  Problem Relation Age of Onset  . Breast cancer  Mother 46  . Hypertension Mother   . Hyperlipidemia Mother   . Hypertension Father   . Thyroid disease Sister     Social History   Socioeconomic History  . Marital status: Married    Spouse name: Not on file  . Number of children: Not on file  . Years of education: Not on file  . Highest education level: Not on file  Occupational History  . Not on file  Tobacco Use  . Smoking status: Never Smoker  . Smokeless tobacco: Never Used  Substance and Sexual Activity  . Alcohol use: Yes    Comment: occasionally  . Drug use: No  . Sexual activity: Not on file  Other Topics Concern  . Not on file  Social History Narrative  . Not on file   Social Determinants of Health   Financial Resource Strain: Not on file  Food Insecurity: Not on file  Transportation Needs: Not on file  Physical Activity: Not on file  Stress: Not on file  Social Connections: Not on file  Intimate Partner Violence: Not on file      Review of Systems  Constitutional: Negative for chills, fatigue and unexpected weight change.  HENT: Negative for congestion, postnasal drip, rhinorrhea, sneezing and sore throat.   Eyes: Negative for redness.  Respiratory: Negative for cough, chest tightness and shortness of breath.   Cardiovascular: Negative for chest pain and palpitations.  Gastrointestinal: Negative for abdominal pain, constipation, diarrhea, nausea and vomiting.  Endocrine:       Hot flash  occasionally, irregular cycle  Genitourinary: Negative for dysuria and frequency.  Musculoskeletal: Positive for arthralgias and back pain. Negative for joint swelling and neck pain.  Skin: Negative for rash.  Neurological: Negative.  Negative for tremors and numbness.  Hematological: Negative for adenopathy. Does not bruise/bleed easily.  Psychiatric/Behavioral: Positive for behavioral problems (Depression). Negative for sleep disturbance and suicidal ideas. The patient is not nervous/anxious.     Vital  Signs: BP 96/60   Pulse 90   Temp 97.7 F (36.5 C)   Resp 16   Ht 5\' 4"  (1.626 m)   Wt 133 lb (60.3 kg)   SpO2 99%   BMI 22.83 kg/m    Physical Exam Vitals and nursing note reviewed.  Constitutional:      General: She is not in acute distress.    Appearance: She is well-developed and normal weight. She is not diaphoretic.  HENT:     Head: Normocephalic and atraumatic.     Mouth/Throat:     Pharynx: No oropharyngeal exudate.  Eyes:     Pupils: Pupils are equal, round, and reactive to light.  Neck:     Thyroid: No thyromegaly.     Vascular: No JVD.     Trachea: No tracheal deviation.  Cardiovascular:     Rate and Rhythm: Normal rate and regular rhythm.     Heart sounds: Normal heart sounds. No murmur heard. No friction rub. No gallop.   Pulmonary:     Effort: Pulmonary effort is normal. No respiratory distress.     Breath sounds: No wheezing or rales.  Chest:     Chest wall: No tenderness.  Abdominal:     General: Bowel sounds are normal.     Palpations: Abdomen is soft.  Musculoskeletal:        General: Normal range of motion.     Cervical back: Normal range of motion and neck supple.  Lymphadenopathy:     Cervical: No cervical adenopathy.  Skin:    General: Skin is warm and dry.  Neurological:     Mental Status: She is alert and oriented to person, place, and time.     Cranial Nerves: No cranial nerve deficit.  Psychiatric:        Behavior: Behavior normal.        Thought Content: Thought content normal.        Judgment: Judgment normal.        Assessment/Plan: 1. Depression, major, single episode, mild (HCC) Will start on cymbalta for depression control as well as menopausal and pain symptoms contorl - DULoxetine (CYMBALTA) 20 MG capsule; Take 1 capsule (20 mg total) by mouth daily.  Dispense: 30 capsule; Refill: 3  2. Menopausal symptoms Start cymbalta to help with depression and aches associated with menopausal symptoms. Can consider HRT in  future - DULoxetine (CYMBALTA) 20 MG capsule; Take 1 capsule (20 mg total) by mouth daily.  Dispense: 30 capsule; Refill: 3  3. Chronic right-sided low back pain with right-sided sciatica Will start cymbalta which may help pain  4. Nonintractable headache, unspecified chronicity pattern, unspecified headache type No headaches recently, has fiorinal as needed  5. Hypercholesterolemia Will work on diet and exercise and continue to monitor--consider statin in future  General Counseling: arantza darrington understanding of the findings of todays visit and agrees with plan of treatment. I have discussed any further diagnostic evaluation that may be needed or ordered today. We also reviewed her medications today. she has been encouraged to call the office with  any questions or concerns that should arise related to todays visit.    No orders of the defined types were placed in this encounter.   Meds ordered this encounter  Medications  . DULoxetine (CYMBALTA) 20 MG capsule    Sig: Take 1 capsule (20 mg total) by mouth daily.    Dispense:  30 capsule    Refill:  3    This patient was seen by Drema Dallas, PA-C in collaboration with Dr. Clayborn Bigness as a part of collaborative care agreement.   Total time spent:30 Minutes Time spent includes review of chart, medications, test results, and follow up plan with the patient.      Dr Lavera Guise Internal medicine

## 2020-12-12 ENCOUNTER — Telehealth: Payer: Self-pay

## 2020-12-12 NOTE — Telephone Encounter (Signed)
Patient wants to cancel her colonoscopy on Friday with Dr. Bonna Gains because she does not know if insurance will cover the procedure. Called Trish in endo and cancel the procedure.

## 2020-12-15 ENCOUNTER — Encounter: Admission: RE | Payer: Self-pay | Source: Home / Self Care

## 2020-12-15 ENCOUNTER — Ambulatory Visit: Admission: RE | Admit: 2020-12-15 | Payer: 59 | Source: Home / Self Care | Admitting: Gastroenterology

## 2020-12-15 SURGERY — COLONOSCOPY WITH PROPOFOL
Anesthesia: General

## 2021-01-25 ENCOUNTER — Encounter: Payer: Self-pay | Admitting: Physician Assistant

## 2021-01-25 ENCOUNTER — Other Ambulatory Visit: Payer: Self-pay

## 2021-01-25 ENCOUNTER — Ambulatory Visit: Payer: 59 | Admitting: Physician Assistant

## 2021-01-25 DIAGNOSIS — R300 Dysuria: Secondary | ICD-10-CM | POA: Diagnosis not present

## 2021-01-25 DIAGNOSIS — M5441 Lumbago with sciatica, right side: Secondary | ICD-10-CM

## 2021-01-25 DIAGNOSIS — M79604 Pain in right leg: Secondary | ICD-10-CM | POA: Diagnosis not present

## 2021-01-25 DIAGNOSIS — M79605 Pain in left leg: Secondary | ICD-10-CM

## 2021-01-25 DIAGNOSIS — G8929 Other chronic pain: Secondary | ICD-10-CM

## 2021-01-25 DIAGNOSIS — F320 Major depressive disorder, single episode, mild: Secondary | ICD-10-CM

## 2021-01-25 DIAGNOSIS — Z23 Encounter for immunization: Secondary | ICD-10-CM

## 2021-01-25 LAB — POCT URINALYSIS DIPSTICK
Bilirubin, UA: NEGATIVE
Blood, UA: NEGATIVE
Glucose, UA: NEGATIVE
Ketones, UA: NEGATIVE
Leukocytes, UA: NEGATIVE
Nitrite, UA: NEGATIVE
Protein, UA: NEGATIVE
Spec Grav, UA: >=1.03 — AB (ref 1.010–1.025)
Urobilinogen, UA: 0.2 E.U./dL
pH, UA: 5 (ref 5.0–8.0)

## 2021-01-25 MED ORDER — TETANUS-DIPHTH-ACELL PERTUSSIS 5-2.5-18.5 LF-MCG/0.5 IM SUSP: 0.5000 mL | Freq: Once | INTRAMUSCULAR | 1 refills | Status: AC

## 2021-01-25 NOTE — Progress Notes (Signed)
[facility] [place], [place] [address]  Internal MEDICINE  Office Visit Note  Patient Name: Denise Logan  [id number]  [id number]  Date of Service: 01/28/2021  Chief Complaint  Patient presents with   Follow-up    Leg pain in both legs mostly right upper leg, burning pain in back/side, numbness in fingers, whole body hurts in the mornings, discuss meds   Quality Metric Gaps    Colonoscopy     HPI Pt is here for routine follow up -Did not schedule mammogram, colonoscopy, or take the cymbalta prescribed at last visit. States her mood has been better and pain goes away once she is up and moving. Just wakes up stiff if she sleeps longer, if she wakes up earlier doesn't hurt. -Needs auth for colonoscopy, told they will cover at 44 so will reschedule this for the fall after her 50th birthday and will schedule mammogram as well -Will try cymbalta to see if it helps with any of the morning pain she experiences -Burnard Bunting done for leg pain at times. This was normal and pain comes and goes. Will continue to monitor  Current Medication: Outpatient Encounter Medications as of 01/25/2021  Medication Sig   butalbital-aspirin-caffeine (FIORINAL) 50-325-40 MG capsule Take 1 capsule by mouth every 4 (four) hours as needed for headache.   DULoxetine (CYMBALTA) 20 MG capsule Take 1 capsule (20 mg total) by mouth daily.   meloxicam (MOBIC) 7.5 MG tablet Take 1 tablet (7.5 mg total) by mouth daily.   [DISCONTINUED] Tdap (BOOSTRIX) 5-2.5-18.5 LF-MCG/0.5 injection Inject 0.5 mLs into the muscle once.   [EXPIRED] Tdap (BOOSTRIX) 5-2.5-18.5 LF-MCG/0.5 injection Inject 0.5 mLs into the muscle once for 1 dose.   No facility-administered encounter medications on file as of 01/25/2021.    Surgical History: Past Surgical History:  Procedure Laterality Date   EYE SURGERY Bilateral     Medical History: Past Medical History:  Diagnosis Date   Cyst of buttocks 2014    Family  History: Family History  Problem Relation Age of Onset   Breast cancer Mother 97   Hypertension Mother    Hyperlipidemia Mother    Hypertension Father    Thyroid disease Sister     Social History   Socioeconomic History   Marital status: Married    Spouse name: Not on file   Number of children: Not on file   Years of education: Not on file   Highest education level: Not on file  Occupational History   Not on file  Tobacco Use   Smoking status: Never   Smokeless tobacco: Never  Substance and Sexual Activity   Alcohol use: Yes    Comment: occasionally   Drug use: No   Sexual activity: Not on file  Other Topics Concern   Not on file  Social History Narrative   Not on file   Social Determinants of Health   Financial Resource Strain: Not on file  Food Insecurity: Not on file  Transportation Needs: Not on file  Physical Activity: Not on file  Stress: Not on file  Social Connections: Not on file  Intimate Partner Violence: Not on file      Review of Systems  Constitutional:  Negative for chills, fatigue and unexpected weight change.  HENT:  Negative for congestion, postnasal drip, rhinorrhea, sneezing and sore throat.   Eyes:  Negative for redness.  Respiratory:  Negative for cough, chest tightness and shortness of breath.   Cardiovascular:  Negative for chest pain  and palpitations.  Gastrointestinal:  Negative for abdominal pain, constipation, diarrhea, nausea and vomiting.  Genitourinary:  Negative for dysuria and frequency.  Musculoskeletal:  Positive for arthralgias and back pain. Negative for joint swelling and neck pain.  Skin:  Negative for rash.  Neurological: Negative.  Negative for tremors and numbness.  Hematological:  Negative for adenopathy. Does not bruise/bleed easily.  Psychiatric/Behavioral:  Negative for behavioral problems (Depression), sleep disturbance and suicidal ideas. The patient is not nervous/anxious.    Vital Signs: BP 120/70   Pulse  75   Temp 97.6 F (36.4 C)   Resp 16   Ht '5\' 4"'$  (1.626 m)   Wt 120 lb 6.4 oz (54.6 kg)   SpO2 99%   BMI 20.67 kg/m    Physical Exam Vitals and nursing note reviewed.  Constitutional:      General: She is not in acute distress.    Appearance: She is well-developed and normal weight. She is not diaphoretic.  HENT:     Head: Normocephalic and atraumatic.     Mouth/Throat:     Pharynx: No oropharyngeal exudate.  Eyes:     Pupils: Pupils are equal, round, and reactive to light.  Neck:     Thyroid: No thyromegaly.     Vascular: No JVD.     Trachea: No tracheal deviation.  Cardiovascular:     Rate and Rhythm: Normal rate and regular rhythm.     Heart sounds: Normal heart sounds. No murmur heard.   No friction rub. No gallop.  Pulmonary:     Effort: Pulmonary effort is normal. No respiratory distress.     Breath sounds: No wheezing or rales.  Chest:     Chest wall: No tenderness.  Abdominal:     General: Bowel sounds are normal.     Palpations: Abdomen is soft.  Musculoskeletal:        General: Normal range of motion.     Cervical back: Normal range of motion and neck supple.     Right lower leg: No edema.     Left lower leg: No edema.  Lymphadenopathy:     Cervical: No cervical adenopathy.  Skin:    General: Skin is warm and dry.  Neurological:     Mental Status: She is alert and oriented to person, place, and time.     Cranial Nerves: No cranial nerve deficit.  Psychiatric:        Behavior: Behavior normal.        Thought Content: Thought content normal.        Judgment: Judgment normal.       Assessment/Plan: 1. Depression, major, single episode, mild ([place]) Per patient her mood has been better, but is willing to try Cymbalta due to added benefits for pain control.  We will continue to monitor symptoms  2. Chronic right-sided low back pain with right-sided sciatica States pain in low back and leg seems to come and go and has been better lately.  We will try  Cymbalta to see if this helps with overall pain management  3. Pain in both lower extremities ABIs done due to intermittent pain in both lower extremities however this was normal today--we will continue to monitor - POCT ABI Screening Pilot No Charge  4. Need for Tdap vaccination - Tdap ([place]) 5-2.5-18.5 LF-MCG/0.5 injection; Inject 0.5 mLs into the muscle once for 1 dose.  Dispense: 0.5 mL; Refill: 1  5. Dysuria - POCT Urinalysis Dipstick   General Counseling: Nori Riis  understanding of the findings of todays visit and agrees with plan of treatment. I have discussed any further diagnostic evaluation that may be needed or ordered today. We also reviewed her medications today. she has been encouraged to call the office with any questions or concerns that should arise related to todays visit.    Orders Placed This Encounter  Procedures   POCT Urinalysis Dipstick   POCT ABI Screening Pilot No Charge    Meds ordered this encounter  Medications   Tdap (BOOSTRIX) 5-2.5-18.5 LF-MCG/0.5 injection    Sig: Inject 0.5 mLs into the muscle once for 1 dose.    Dispense:  0.5 mL    Refill:  1    This patient was seen by Drema Dallas, PA-C in collaboration with Dr. Clayborn Bigness as a part of collaborative care agreement.   Total time spent:35 Minutes Time spent includes review of chart, medications, test results, and follow up plan with the patient.      Dr Lavera Guise Internal medicine

## 2021-02-08 ENCOUNTER — Ambulatory Visit: Payer: 59 | Admitting: Physician Assistant

## 2021-02-08 ENCOUNTER — Other Ambulatory Visit: Payer: Self-pay

## 2021-02-08 ENCOUNTER — Encounter: Payer: Self-pay | Admitting: Physician Assistant

## 2021-02-08 DIAGNOSIS — H6690 Otitis media, unspecified, unspecified ear: Secondary | ICD-10-CM | POA: Diagnosis not present

## 2021-02-08 MED ORDER — AMOXICILLIN-POT CLAVULANATE 875-125 MG PO TABS: 1.0000 | ORAL_TABLET | Freq: Two times a day (BID) | ORAL | 0 refills | Status: DC

## 2021-02-08 NOTE — Progress Notes (Signed)
[facility] [place], [place] [address]  Internal MEDICINE  Office Visit Note  Patient Name: Denise Logan  [id number]  [id number]  Date of Service: 02/13/2021  Chief Complaint  Patient presents with   Acute Visit   Ear Pain    Left, pain started last night, pain is off and on but pt will be flying this weekend and is worried      HPI Pt is here for a sick visit. -Left ear pain started yesterday and has been constant and may be worsening. -Leaves for [place] on Satruday and will be flying and is worried about increasing ear pain while flying -States it feels like there is a lot of pressure, but does not complain of much congestion at this time. Does have some though and will take mucinex if needed.  Current Medication:  Outpatient Encounter Medications as of 02/08/2021  Medication Sig   amoxicillin-clavulanate (AUGMENTIN) 875-125 MG tablet Take 1 tablet by mouth 2 (two) times daily.   butalbital-aspirin-caffeine (FIORINAL) 50-325-40 MG capsule Take 1 capsule by mouth every 4 (four) hours as needed for headache.   DULoxetine (CYMBALTA) 20 MG capsule Take 1 capsule (20 mg total) by mouth daily.   meloxicam (MOBIC) 7.5 MG tablet Take 1 tablet (7.5 mg total) by mouth daily.   No facility-administered encounter medications on file as of 02/08/2021.      Medical History: Past Medical History:  Diagnosis Date   Cyst of buttocks 2014     Vital Signs: BP 108/70   Pulse 70   Temp 97.7 F (36.5 C)   Resp 16   Ht '5\' 4"'$  (1.626 m)   Wt 119 lb 12.8 oz (54.3 kg)   SpO2 99%   BMI 20.56 kg/m    Review of Systems  Constitutional:  Negative for fatigue and fever.  HENT:  Positive for ear pain, postnasal drip and sinus pressure. Negative for congestion and mouth sores.   Respiratory:  Negative for cough.   Cardiovascular:  Negative for chest pain.  Genitourinary:  Negative for flank pain.  Psychiatric/Behavioral: Negative.     Physical Exam Vitals  and nursing note reviewed.  Constitutional:      General: She is not in acute distress.    Appearance: She is well-developed and normal weight. She is not diaphoretic.  HENT:     Head: Normocephalic and atraumatic.     Right Ear: Tympanic membrane normal.     Ears:     Comments: Left TM is difficult to fully visualize, does appear to have some fluid, non-erythematous    Mouth/Throat:     Pharynx: No oropharyngeal exudate.  Eyes:     Pupils: Pupils are equal, round, and reactive to light.  Neck:     Thyroid: No thyromegaly.     Vascular: No JVD.     Trachea: No tracheal deviation.  Cardiovascular:     Rate and Rhythm: Normal rate and regular rhythm.     Heart sounds: Normal heart sounds. No murmur heard.   No friction rub. No gallop.  Pulmonary:     Effort: Pulmonary effort is normal. No respiratory distress.     Breath sounds: No wheezing or rales.  Chest:     Chest wall: No tenderness.  Abdominal:     General: Bowel sounds are normal.     Palpations: Abdomen is soft.  Musculoskeletal:        General: Normal range of motion.     Cervical back: Normal  range of motion and neck supple.  Lymphadenopathy:     Cervical: No cervical adenopathy.  Skin:    General: Skin is warm and dry.  Neurological:     Mental Status: She is alert and oriented to person, place, and time.     Cranial Nerves: No cranial nerve deficit.  Psychiatric:        Behavior: Behavior normal.        Thought Content: Thought content normal.        Judgment: Judgment normal.      Assessment/Plan: 1. Acute otitis media, unspecified otitis media type Will start on augmentin and advised to take with food. Pt should also take mucinex and use flonase for any congestion and post nasal drip. - amoxicillin-clavulanate (AUGMENTIN) 875-125 MG tablet; Take 1 tablet by mouth 2 (two) times daily.  Dispense: 14 tablet; Refill: 0   General Counseling: Denise Logan verbalizes understanding of the findings of todays visit and  agrees with plan of treatment. I have discussed any further diagnostic evaluation that may be needed or ordered today. We also reviewed her medications today. she has been encouraged to call the office with any questions or concerns that should arise related to todays visit.    Counseling:    No orders of the defined types were placed in this encounter.   Meds ordered this encounter  Medications   amoxicillin-clavulanate (AUGMENTIN) 875-125 MG tablet    Sig: Take 1 tablet by mouth 2 (two) times daily.    Dispense:  14 tablet    Refill:  0    Time spent:20 Minutes

## 2021-05-14 ENCOUNTER — Ambulatory Visit (INDEPENDENT_AMBULATORY_CARE_PROVIDER_SITE_OTHER): Payer: 59 | Admitting: Dermatology

## 2021-05-14 ENCOUNTER — Other Ambulatory Visit: Payer: Self-pay

## 2021-05-14 DIAGNOSIS — L578 Other skin changes due to chronic exposure to nonionizing radiation: Secondary | ICD-10-CM

## 2021-05-14 DIAGNOSIS — D1801 Hemangioma of skin and subcutaneous tissue: Secondary | ICD-10-CM | POA: Diagnosis not present

## 2021-05-14 DIAGNOSIS — D485 Neoplasm of uncertain behavior of skin: Secondary | ICD-10-CM

## 2021-05-14 NOTE — Patient Instructions (Signed)

## 2021-05-14 NOTE — Progress Notes (Signed)
   New Patient Visit  Subjective  Denise Logan is a 50 y.o. female who presents for the following: Lesion (On the forehead - has been there 20 years, she would like it removed today.).  It is growing and changing and irritating. Patient accompanied by sister today.  She has history of  sun exposure in the past.  The following portions of the chart were reviewed this encounter and updated as appropriate:   Tobacco  Allergies  Meds  Problems  Med Hx  Surg Hx  Fam Hx     Review of Systems:  No other skin or systemic complaints except as noted in HPI or Assessment and Plan.  Objective  Well appearing patient in no apparent distress; mood and affect are within normal limits.  A focused examination was performed including the face. Relevant physical exam findings are noted in the Assessment and Plan.  R forehead 0.7 cm red papule.       Assessment & Plan  Neoplasm of uncertain behavior of skin R forehead  Epidermal / dermal shaving  Lesion diameter (cm):  0.7 Informed consent: discussed and consent obtained   Timeout: patient name, date of birth, surgical site, and procedure verified   Procedure prep:  Patient was prepped and draped in usual sterile fashion Prep type:  Isopropyl alcohol Anesthesia: the lesion was anesthetized in a standard fashion   Anesthetic:  1% lidocaine w/ epinephrine 1-100,000 buffered w/ 8.4% NaHCO3 Instrument used: flexible razor blade   Hemostasis achieved with: pressure, aluminum chloride and electrodesiccation   Outcome: patient tolerated procedure well   Post-procedure details: sterile dressing applied and wound care instructions given   Dressing type: bandage and petrolatum    Specimen 1 - Surgical pathology Differential Diagnosis: D48.5 irritated nevus r/o dysplasia vs hemangioma.  Check Margins: No  Actinic Damage - chronic, secondary to cumulative UV radiation exposure/sun exposure over time - diffuse scaly erythematous macules with  underlying dyspigmentation - Recommend daily broad spectrum sunscreen SPF 30+ to sun-exposed areas, reapply every 2 hours as needed.  - Recommend staying in the shade or wearing long sleeves, sun glasses (UVA+UVB protection) and wide brim hats (4-inch brim around the entire circumference of the hat). - Call for new or changing lesions.  Return if symptoms worsen or fail to improve.  Denise Logan, CMA, am acting as scribe for Denise Ser, MD . Documentation: I have reviewed the above documentation for accuracy and completeness, and I agree with the above.  Denise Ser, MD

## 2021-05-20 ENCOUNTER — Encounter: Payer: Self-pay | Admitting: Dermatology

## 2021-05-22 ENCOUNTER — Telehealth: Payer: Self-pay

## 2021-05-22 NOTE — Telephone Encounter (Signed)
-----   Message from Ralene Bathe, MD sent at 05/17/2021  3:00 PM EST ----- Diagnosis Skin , right forehead ARTERIOVENOUS HEMANGIOMA  Benign hemangioma = collection of blood vessels No further treatment needed

## 2021-05-22 NOTE — Telephone Encounter (Signed)
Advised patient of results/hd  

## 2021-05-28 ENCOUNTER — Ambulatory Visit: Payer: 59 | Admitting: Physician Assistant

## 2021-05-28 ENCOUNTER — Encounter: Payer: Self-pay | Admitting: Physician Assistant

## 2021-05-28 ENCOUNTER — Other Ambulatory Visit: Payer: Self-pay

## 2021-05-28 DIAGNOSIS — M5441 Lumbago with sciatica, right side: Secondary | ICD-10-CM

## 2021-05-28 DIAGNOSIS — G8929 Other chronic pain: Secondary | ICD-10-CM

## 2021-05-28 DIAGNOSIS — F320 Major depressive disorder, single episode, mild: Secondary | ICD-10-CM | POA: Diagnosis not present

## 2021-05-28 NOTE — Progress Notes (Signed)
[facility] [place], [place] [address]  Internal MEDICINE  Office Visit Note  Patient Name: Denise Logan  [id number]  [id number]  Date of Service: 05/29/2021  Chief Complaint  Patient presents with   Follow-up   Migraine    HPI Pt is here for routine follow up -Sleeps for about 5 hours and feels better now -Headaches not very frequently. Only takes fiorinal maybe once per month -Depression has been manageable, does not like taking medications. Is interested in psych referral to try counseling instead -Wants to rescheduled colonoscopy now that she is 50yo since insurance would not approve it until she turned 77. She will contact their office to reschedule. She will consider mammogram as well--order previously placed -right leg pain/sciatica still present. She has tried voltaren and it helped some so will continue that, but is also interested in evaluation to see if an injection might help since she doesn't like taking medications. Will refer to orthopedics for further evaluation and treatment -Does report she went to dermatology for skin lesion removal from forehead and states it was benign and is healing well  Current Medication: Outpatient Encounter Medications as of 05/28/2021  Medication Sig   butalbital-aspirin-caffeine (FIORINAL) 50-325-40 MG capsule Take 1 capsule by mouth every 4 (four) hours as needed for headache.   Zoster Vaccine Adjuvanted [facility]) injection Inject 0.5 mLs into the muscle once.   [DISCONTINUED] amoxicillin-clavulanate (AUGMENTIN) 875-125 MG tablet Take 1 tablet by mouth 2 (two) times daily.   [DISCONTINUED] DULoxetine (CYMBALTA) 20 MG capsule Take 1 capsule (20 mg total) by mouth daily. (Patient not taking: Reported on 05/28/2021)   [DISCONTINUED] meloxicam (MOBIC) 7.5 MG tablet Take 1 tablet (7.5 mg total) by mouth daily. (Patient not taking: Reported on 05/28/2021)   No facility-administered encounter medications on file as of  05/28/2021.    Surgical History: Past Surgical History:  Procedure Laterality Date   EYE SURGERY Bilateral     Medical History: Past Medical History:  Diagnosis Date   Cyst of buttocks 2014   Migraines     Family History: Family History  Problem Relation Age of Onset   Breast cancer Mother 33   Hypertension Mother    Hyperlipidemia Mother    Hypertension Father    Thyroid disease Sister     Social History   Socioeconomic History   Marital status: Married    Spouse name: Not on file   Number of children: Not on file   Years of education: Not on file   Highest education level: Not on file  Occupational History   Not on file  Tobacco Use   Smoking status: Never   Smokeless tobacco: Never  Substance and Sexual Activity   Alcohol use: Yes    Comment: occasionally   Drug use: No   Sexual activity: Not on file  Other Topics Concern   Not on file  Social History Narrative   Not on file   Social Determinants of Health   Financial Resource Strain: Not on file  Food Insecurity: Not on file  Transportation Needs: Not on file  Physical Activity: Not on file  Stress: Not on file  Social Connections: Not on file  Intimate Partner Violence: Not on file      Review of Systems  Constitutional:  Negative for chills, fatigue and unexpected weight change.  HENT:  Negative for congestion, postnasal drip, rhinorrhea, sneezing and sore throat.   Eyes:  Negative for redness.  Respiratory:  Negative for cough,  chest tightness and shortness of breath.   Cardiovascular:  Negative for chest pain and palpitations.  Gastrointestinal:  Negative for abdominal pain, constipation, diarrhea, nausea and vomiting.  Genitourinary:  Negative for dysuria and frequency.  Musculoskeletal:  Positive for arthralgias and back pain. Negative for joint swelling and neck pain.  Skin:  Negative for rash.  Neurological: Negative.  Negative for tremors and numbness.  Hematological:  Negative for  adenopathy. Does not bruise/bleed easily.  Psychiatric/Behavioral:  Negative for behavioral problems (Depression), sleep disturbance and suicidal ideas. The patient is not nervous/anxious.    Vital Signs: BP 106/71   Pulse 73   Temp 98 F (36.7 C)   Resp 16   Ht 5\' 4"  (1.626 m)   Wt 126 lb (57.2 kg)   SpO2 99%   BMI 21.63 kg/m    Physical Exam Vitals and nursing note reviewed.  Constitutional:      General: She is not in acute distress.    Appearance: She is well-developed and normal weight. She is not diaphoretic.  HENT:     Head: Normocephalic and atraumatic.     Mouth/Throat:     Pharynx: No oropharyngeal exudate.  Eyes:     Pupils: Pupils are equal, round, and reactive to light.  Neck:     Thyroid: No thyromegaly.     Vascular: No JVD.     Trachea: No tracheal deviation.  Cardiovascular:     Rate and Rhythm: Normal rate and regular rhythm.     Heart sounds: Normal heart sounds. No murmur heard.   No friction rub. No gallop.  Pulmonary:     Effort: Pulmonary effort is normal. No respiratory distress.     Breath sounds: No wheezing or rales.  Chest:     Chest wall: No tenderness.  Abdominal:     General: Bowel sounds are normal.     Palpations: Abdomen is soft.  Musculoskeletal:        General: Normal range of motion.     Cervical back: Normal range of motion and neck supple.     Right lower leg: No edema.     Left lower leg: No edema.  Lymphadenopathy:     Cervical: No cervical adenopathy.  Skin:    General: Skin is warm and dry.  Neurological:     Mental Status: She is alert and oriented to person, place, and time.     Cranial Nerves: No cranial nerve deficit.  Psychiatric:        Behavior: Behavior normal.        Thought Content: Thought content normal.        Judgment: Judgment normal.       Assessment/Plan: 1. Chronic right-sided low back pain with right-sided sciatica Patient has tried conservative approach and does not like to take  medications regularly. May continue voltaren as needed and had discussed alternatives such as gabapentin or pursuing imaging. Will refer to ortho for further eval and treat - AMB referral to orthopedics  2. Depression, major, single episode, mild ([place]) Interested in therapy and will refer to psych for this. - Ambulatory referral to Psychiatry   General Counseling: cleaster shiffer understanding of the findings of todays visit and agrees with plan of treatment. I have discussed any further diagnostic evaluation that may be needed or ordered today. We also reviewed her medications today. she has been encouraged to call the office with any questions or concerns that should arise related to todays visit.    Orders  Placed This Encounter  Procedures   Ambulatory referral to Psychiatry   AMB referral to orthopedics    No orders of the defined types were placed in this encounter.   This patient was seen by Drema Dallas, PA-C in collaboration with Dr. Clayborn Bigness as a part of collaborative care agreement.   Total time spent:30 Minutes Time spent includes review of chart, medications, test results, and follow up plan with the patient.      Dr Lavera Guise Internal medicine

## 2021-05-31 ENCOUNTER — Encounter: Payer: Self-pay | Admitting: Physician Assistant

## 2021-05-31 ENCOUNTER — Other Ambulatory Visit: Payer: Self-pay

## 2021-05-31 ENCOUNTER — Telehealth (INDEPENDENT_AMBULATORY_CARE_PROVIDER_SITE_OTHER): Payer: 59 | Admitting: Physician Assistant

## 2021-05-31 VITALS — Ht 64.0 in | Wt 126.0 lb

## 2021-05-31 DIAGNOSIS — J029 Acute pharyngitis, unspecified: Secondary | ICD-10-CM | POA: Diagnosis not present

## 2021-05-31 NOTE — Progress Notes (Signed)
[facility] [place], [place] [address]  Internal MEDICINE  Telephone Visit  Patient Name: Denise Logan  [id number]  [id number]  Date of Service: 06/08/2021  I connected with the patient at 1:58 by telephone and verified the patients identity using two identifiers.   I discussed the limitations, risks, security and privacy concerns of performing an evaluation and management service by telephone and the availability of in person appointments. I also discussed with the patient that there may be a patient responsible charge related to the service.  The patient expressed understanding and agrees to proceed.    Chief Complaint  Patient presents with   Telephone Assessment    [phone]   Telephone Screen   Sore Throat    HPI Pt is here for a virtual sick visit -She has been experiencing a sore throat for the past 2 days. A little fatigued as well. -No congestion, cough, Sob, fevers, chills, body aches  -Tylenol helps a little -No sick contacts -Covid test was negative  Current Medication: Outpatient Encounter Medications as of 05/31/2021  Medication Sig   butalbital-aspirin-caffeine (FIORINAL) 50-325-40 MG capsule Take 1 capsule by mouth every 4 (four) hours as needed for headache.   Zoster Vaccine Adjuvanted [facility]) injection Inject 0.5 mLs into the muscle once.   No facility-administered encounter medications on file as of 05/31/2021.    Surgical History: Past Surgical History:  Procedure Laterality Date   EYE SURGERY Bilateral     Medical History: Past Medical History:  Diagnosis Date   Cyst of buttocks 2014   Migraines     Family History: Family History  Problem Relation Age of Onset   Breast cancer Mother 54   Hypertension Mother    Hyperlipidemia Mother    Hypertension Father    Thyroid disease Sister     Social History   Socioeconomic History   Marital status: Married    Spouse name: Not on file   Number of children: Not on  file   Years of education: Not on file   Highest education level: Not on file  Occupational History   Not on file  Tobacco Use   Smoking status: Never   Smokeless tobacco: Never  Substance and Sexual Activity   Alcohol use: Yes    Comment: occasionally   Drug use: No   Sexual activity: Not on file  Other Topics Concern   Not on file  Social History Narrative   Not on file   Social Determinants of Health   Financial Resource Strain: Not on file  Food Insecurity: Not on file  Transportation Needs: Not on file  Physical Activity: Not on file  Stress: Not on file  Social Connections: Not on file  Intimate Partner Violence: Not on file      Review of Systems  Constitutional:  Positive for fatigue. Negative for fever.  HENT:  Positive for postnasal drip and sore throat. Negative for congestion and mouth sores.   Respiratory:  Negative for cough, shortness of breath and wheezing.   Cardiovascular:  Negative for chest pain.  Genitourinary:  Negative for flank pain.  Psychiatric/Behavioral: Negative.     Vital Signs: Ht 5\' 4"  (1.626 m)   Wt 126 lb (57.2 kg)   BMI 21.63 kg/m    Observation/Objective:  Pt is able to carry out conversation   Assessment/Plan: 1. Sore throat Advised to gargle salt water and drink warm tea with honey or use throat lozenges to soothe throat. May also try  nasal spay as postnasal drip can contribute to sore throat. Advised to call office if any worsening or new symptoms tomorrow as an antibiotic may be called in at that time, however given current timeline and symptoms will hold off for now.   General Counseling: deshondra worst understanding of the findings of today's phone visit and agrees with plan of treatment. I have discussed any further diagnostic evaluation that may be needed or ordered today. We also reviewed her medications today. she has been encouraged to call the office with any questions or concerns that should arise related to  todays visit.    No orders of the defined types were placed in this encounter.   No orders of the defined types were placed in this encounter.   Time spent:25 Minutes    Dr Lavera Guise Internal medicine

## 2021-06-04 ENCOUNTER — Other Ambulatory Visit: Payer: Self-pay

## 2021-06-04 ENCOUNTER — Telehealth: Payer: Self-pay

## 2021-06-04 MED ORDER — PREDNISONE 10 MG PO TABS: ORAL_TABLET | ORAL | 0 refills | Status: DC

## 2021-06-04 MED ORDER — AZITHROMYCIN 250 MG PO TABS: ORAL_TABLET | ORAL | 0 refills | Status: DC

## 2021-06-04 NOTE — Telephone Encounter (Signed)
Orthopedic referral sent via [facility] to [facility]

## 2021-06-04 NOTE — Telephone Encounter (Signed)
spoke with pt that is Covid positive,runny nose,headache body aches and cough  as per lauren send prednisone and Zithromax for 10 days  and advised to take OTC Claritin and take mucin for cough and Tylenol for fever

## 2021-06-04 NOTE — Telephone Encounter (Signed)
Patient called stating she tested positive for covid yesterday. Requesting meds to be called in for her-Toni

## 2021-06-08 NOTE — Telephone Encounter (Signed)
Ortho appointment scheduled for 06/19/21 @ 10:00-Toni

## 2021-07-18 ENCOUNTER — Other Ambulatory Visit: Payer: Self-pay

## 2021-07-18 ENCOUNTER — Ambulatory Visit
Admission: RE | Admit: 2021-07-18 | Discharge: 2021-07-18 | Disposition: A | Discharge status: Home / Self Care | Payer: 59 | Source: Ambulatory Visit | Attending: Physician Assistant | Admitting: Physician Assistant

## 2021-07-18 ENCOUNTER — Other Ambulatory Visit: Payer: Self-pay | Admitting: Physician Assistant

## 2021-07-18 DIAGNOSIS — Z1231 Encounter for screening mammogram for malignant neoplasm of breast: Secondary | ICD-10-CM | POA: Insufficient documentation

## 2021-07-18 DIAGNOSIS — R928 Other abnormal and inconclusive findings on diagnostic imaging of breast: Secondary | ICD-10-CM

## 2021-07-25 ENCOUNTER — Other Ambulatory Visit: Payer: Self-pay

## 2021-07-25 ENCOUNTER — Ambulatory Visit
Admission: RE | Admit: 2021-07-25 | Discharge: 2021-07-25 | Disposition: A | Discharge status: Home / Self Care | Payer: 59 | Source: Ambulatory Visit | Attending: Physician Assistant | Admitting: Physician Assistant

## 2021-07-25 DIAGNOSIS — R922 Inconclusive mammogram: Secondary | ICD-10-CM | POA: Diagnosis not present

## 2021-07-25 DIAGNOSIS — R928 Other abnormal and inconclusive findings on diagnostic imaging of breast: Secondary | ICD-10-CM | POA: Diagnosis not present

## 2021-07-26 ENCOUNTER — Other Ambulatory Visit: Payer: Self-pay | Admitting: Physician Assistant

## 2021-07-26 DIAGNOSIS — R928 Other abnormal and inconclusive findings on diagnostic imaging of breast: Secondary | ICD-10-CM

## 2021-08-06 ENCOUNTER — Encounter: Payer: Self-pay | Admitting: Physician Assistant

## 2021-08-06 ENCOUNTER — Other Ambulatory Visit: Payer: Self-pay

## 2021-08-06 ENCOUNTER — Ambulatory Visit: Payer: 59 | Admitting: Physician Assistant

## 2021-08-06 ENCOUNTER — Telehealth: Payer: Self-pay

## 2021-08-06 VITALS — BP 102/62 | HR 75 | Temp 98.5°F | Resp 16 | Ht 64.0 in | Wt 127.6 lb

## 2021-08-06 DIAGNOSIS — M26609 Unspecified temporomandibular joint disorder, unspecified side: Secondary | ICD-10-CM | POA: Diagnosis not present

## 2021-08-06 NOTE — Progress Notes (Signed)
[facility] [place], [place] [address]  Internal MEDICINE  Office Visit Note  Patient Name: Denise Logan  [id number]  [id number]  Date of Service: 08/15/2021  Chief Complaint  Patient presents with   Facial Pain    Pt has left side facial pain - ear and jaw both hurt, especially when having to open her mouth     HPI Pt is here for a sick visit. -She has been having left sided ear/face pain -She has had this at least once before -Discussed her symptoms are more consistent with TMJ not an ear infection and exam supports this -Pain with opening jaw or chewing and some clicking with opening  Current Medication:  Outpatient Encounter Medications as of 08/06/2021  Medication Sig   butalbital-aspirin-caffeine (FIORINAL) 50-325-40 MG capsule Take 1 capsule by mouth every 4 (four) hours as needed for headache.   Zoster Vaccine Adjuvanted [facility]) injection Inject 0.5 mLs into the muscle once.   [DISCONTINUED] predniSONE (DELTASONE) 10 MG tablet Take 1 tab po 3 x day for 3 days then take 1 tab po 2 x a day for 3 days and then take 1 tab po daily for 3 days   [DISCONTINUED] azithromycin (ZITHROMAX Z-PAK) 250 MG tablet Take 1 tab po daily for 10 days (Patient not taking: Reported on 08/06/2021)   No facility-administered encounter medications on file as of 08/06/2021.      Medical History: Past Medical History:  Diagnosis Date   Cyst of buttocks 2014   Migraines      Vital Signs: BP 102/62    Pulse 75    Temp 98.5 F (36.9 C)    Resp 16    Ht 5\' 4"  (1.626 m)    Wt 127 lb 9.6 oz (57.9 kg)    SpO2 98%    BMI 21.90 kg/m    Review of Systems  Constitutional:  Negative for fatigue and fever.  HENT:  Negative for congestion, mouth sores and postnasal drip.        Left side jaw pain  Respiratory:  Negative for cough.   Cardiovascular:  Negative for chest pain.  Genitourinary:  Negative for flank pain.  Psychiatric/Behavioral: Negative.     Physical  Exam Vitals and nursing note reviewed.  Constitutional:      General: She is not in acute distress.    Appearance: She is well-developed and normal weight. She is not diaphoretic.  HENT:     Head: Normocephalic and atraumatic.     Jaw: Tenderness and pain on movement present.     Right Ear: Tympanic membrane normal.     Left Ear: Tympanic membrane normal.     Mouth/Throat:     Pharynx: No oropharyngeal exudate.  Eyes:     Pupils: Pupils are equal, round, and reactive to light.  Neck:     Thyroid: No thyromegaly.     Vascular: No JVD.     Trachea: No tracheal deviation.  Cardiovascular:     Rate and Rhythm: Normal rate and regular rhythm.     Heart sounds: Normal heart sounds. No murmur heard.   No friction rub. No gallop.  Pulmonary:     Effort: Pulmonary effort is normal. No respiratory distress.     Breath sounds: No wheezing or rales.  Chest:     Chest wall: No tenderness.  Abdominal:     General: Bowel sounds are normal.     Palpations: Abdomen is soft.  Musculoskeletal:  General: Normal range of motion.     Cervical back: Normal range of motion and neck supple.  Lymphadenopathy:     Cervical: No cervical adenopathy.  Skin:    General: Skin is warm and dry.  Neurological:     Mental Status: She is alert and oriented to person, place, and time.     Cranial Nerves: No cranial nerve deficit.  Psychiatric:        Behavior: Behavior normal.        Thought Content: Thought content normal.        Judgment: Judgment normal.      Assessment/Plan: 1. TMJ (temporomandibular joint disorder) Discussed trying night time bite guard, chewing soft food, and avoiding opening mouth wide. May take some ibuprofen to help with pain and inflammation as needed.   General Counseling: karris deangelo understanding of the findings of todays visit and agrees with plan of treatment. I have discussed any further diagnostic evaluation that may be needed or ordered today. We also  reviewed her medications today. she has been encouraged to call the office with any questions or concerns that should arise related to todays visit.    Counseling:    No orders of the defined types were placed in this encounter.   No orders of the defined types were placed in this encounter.   Time spent:25 Minutes

## 2021-08-06 NOTE — Telephone Encounter (Signed)
Pt LMOM that she is having ear pain we tried to call pt back LMOM to call us back to schedule appt

## 2021-08-08 ENCOUNTER — Ambulatory Visit
Admission: RE | Admit: 2021-08-08 | Discharge: 2021-08-08 | Disposition: A | Discharge status: Home / Self Care | Payer: 59 | Source: Ambulatory Visit | Attending: Physician Assistant | Admitting: Physician Assistant

## 2021-08-08 ENCOUNTER — Other Ambulatory Visit: Payer: Self-pay

## 2021-08-08 DIAGNOSIS — N6081 Other benign mammary dysplasias of right breast: Secondary | ICD-10-CM | POA: Diagnosis not present

## 2021-08-08 DIAGNOSIS — R928 Other abnormal and inconclusive findings on diagnostic imaging of breast: Secondary | ICD-10-CM | POA: Diagnosis not present

## 2021-08-08 HISTORY — PX: BREAST BIOPSY: SHX20

## 2021-08-09 LAB — SURGICAL PATHOLOGY

## 2021-08-09 NOTE — Progress Notes (Signed)
Received referral for surgical consult  from Electa Sniff at [facility].  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 Scheduled  with Dr. Hampton Abbot 08/13/20. Patient aware of appointment, and has office contact info.

## 2021-08-13 ENCOUNTER — Encounter: Payer: Self-pay | Admitting: Surgery

## 2021-08-13 ENCOUNTER — Ambulatory Visit (INDEPENDENT_AMBULATORY_CARE_PROVIDER_SITE_OTHER): Payer: 59 | Admitting: Surgery

## 2021-08-13 ENCOUNTER — Other Ambulatory Visit: Payer: Self-pay

## 2021-08-13 VITALS — BP 110/73 | HR 88 | Temp 98.6°F | Ht 64.0 in | Wt 128.4 lb

## 2021-08-13 DIAGNOSIS — N6489 Other specified disorders of breast: Secondary | ICD-10-CM | POA: Diagnosis not present

## 2021-08-13 NOTE — Patient Instructions (Signed)
Our surgery scheduler will call you within 24-48 hours to schedule your surgery. Please have the Blue surgery sheet available when speaking with her.   Lumpectomy A lumpectomy, sometimes called a partial mastectomy, is surgery to remove a cancerous tumor or mass (the lump) from a breast. It is a form of breast-conserving or breast-preservation surgery. This means that the cancerous tissue is removed but the breast remains intact. During a lumpectomy, the portion of the breast that contains the tumor is removed. Some normal tissue around the lump may be taken out to make sure that all of the tumor has been removed. Lymph nodes under your arm may also be removed and tested to find out if the cancer has spread. Lymph nodes are part of the body's disease-fighting system (immune system) and are usually the first place where breast cancer spreads. Tell a health care provider about: Any allergies you have. All medicines you are taking, including vitamins, herbs, eye drops, creams, and over-the-counter medicines. Any problems you or family members have had with anesthetic medicines. Any blood disorders you have. Any surgeries you have had. Any medical conditions you have. Whether you are pregnant or may be pregnant. What are the risks? Generally, this is a safe procedure. However, problems may occur, including: Bleeding. Infection. Allergic reaction to medicines. Pain, swelling, weakness, or numbness in the arm on the side of your surgery. Temporary swelling. Change in the shape of the breast, particularly if a large portion is removed. Scar tissue that forms at the surgical site and feels hard to the touch. Blood clots. What happens before the procedure? Staying hydrated Follow instructions from your health care provider about hydration, which may include: Up to 2 hours before the procedure - you may continue to drink clear liquids, such as water, clear fruit juice, black coffee, and plain  tea.  Eating and drinking restrictions Follow instructions from your health care provider about eating and drinking, which may include: 8 hours before the procedure - stop eating heavy meals or foods, such as meat, fried foods, or fatty foods. 6 hours before the procedure - stop eating light meals or foods, such as toast or cereal. 6 hours before the procedure - stop drinking milk or drinks that contain milk. 2 hours before the procedure - stop drinking clear liquids. Medicines Ask your health care provider about: Changing or stopping your regular medicines. This is especially important if you are taking diabetes medicines or blood thinners. Taking medicines such as aspirin and ibuprofen. These medicines can thin your blood. Do not take these medicines unless your health care provider tells you to take them. Taking over-the-counter medicines, vitamins, herbs, and supplements. General instructions Prior to surgery, your health care provider may do a procedure to locate and mark the tumor area in your breast (localization). This will help guide your surgeon to where the incision will be made. This may be done with: Imaging, such as a mammogram, ultrasound, or MRI. Insertion of a small wire, clip, or seed, or an implant that will reflect a radar signal. You may have screening tests or exams to get baseline measurements of your arm. These can be compared to measurements done after surgery to monitor for swelling (lymphedema) that can develop after having lymph nodes removed. Ask your health care provider: How your surgery site will be marked. What steps will be taken to help prevent infection. These may include: Washing skin with a germ-killing soap. Taking antibiotic medicine. Plan to have someone take you home   from the hospital or clinic. Plan to have a responsible adult care for you for at least 24 hours after you leave the hospital or clinic. This is important. What happens during the  procedure?  An IV will be inserted into one of your veins. You will be given one or more of the following: A medicine to help you relax (sedative). A medicine to numb the area (local anesthetic). A medicine to make you fall asleep (general anesthetic). Your health care provider will use a kind of electric scalpel that uses heat to reduce bleeding (electrocautery knife). A curved incision that follows the natural curve of your breast will be made. This type of incision will allow for minimal scarring and better healing. The tumor will be removed along with some of the tissue around it. This will be sent to the lab for testing. Your health care provider may also remove lymph nodes at this time if needed. If the tumor is close to the muscles over your chest, some muscle tissue may also be removed. A small drain tube may be inserted into your breast area or armpit to collect fluid that may build up after surgery. This tube will be connected to a suction bulb on the outside of your body to remove the fluid. The incision will be closed with stitches (sutures). A bandage (dressing) may be placed over the incision. The procedure may vary among health care providers and hospitals. What happens after the procedure? Your blood pressure, heart rate, breathing rate, and blood oxygen level will be monitored until you leave the hospital or clinic. You will be given medicine for pain as needed. Your IV will be removed when you are able to eat and drink by mouth. You will be encouraged to get up and walk as soon as you can. This is important to improve blood flow and breathing. Ask for help if you feel weak or unsteady. You may have: A drain tube in place for 2-3 days to prevent a collection of blood (hematoma) from developing in the breast. You will be given instructions about caring for the drain before you go home. A pressure bandage applied for 1-2 days to prevent bleeding or swelling. Your pressure bandage  may look like a thick piece of fabric or an elastic wrap. Ask your health care provider how to care for your bandage at home. You may be given a tight sleeve to wear over your arm on the side of your surgery. You should wear this sleeve as told by your health care provider. Do not drive for 24 hours if you were given a sedative during your procedure. Summary A lumpectomy, sometimes called a partial mastectomy, is surgery to remove a cancerous tumor or mass (the lump) from a breast. During a lumpectomy, the portion of the breast that contains the tumor is removed. Lymph nodes under your arm may also be removed and tested to find out if the cancer has spread. Plan to have someone take you home from the hospital or clinic. You may have a drain tube in place for 2-3 days to prevent a collection of blood (hematoma) from developing in the breast. You will be given instructions about caring for the drain before you go home. This information is not intended to replace advice given to you by your health care provider. Make sure you discuss any questions you have with your health care provider. Document Revised: 12/21/2018 Document Reviewed: 12/21/2018 Elsevier Patient Education  2022 Elsevier Inc.  

## 2021-08-13 NOTE — H&P (View-Only) (Signed)
08/13/2021  Reason for Visit:  Right breast radial scar  Requesting Provider:  Al Pimple, RN  History of Present Illness: Denise Logan is a 51 y.o. female presenting for evaluation of right breast radial scar.  The patient had a bilateral screening mammogram on 07/18/2021 which showed an area of distortion in the right breast which was confirmed on diagnostic mammogram on 07/25/2021.  This showed a persistent distortion in the right upper inner quadrant of the breast at middle depth.  There was also an oval obscured mass in the right outer breast which on ultrasound was found to be a benign simple cyst.  This area of distortion in the upper inner quadrant was biopsied on 08/08/2021 and the results have shown focal changes compatible with radial scar with some background of stromal fibrosis and fibrocystic changes but negative for any atypia or malignancy.  The patient presents today for surgical evaluation.  The patient reports a maternal history of breast cancer.  Patient had first menses at age 9 and is G2 P2 with a first pregnancy at age 51.  She did not feel any masses in her right breast and currently she is only having some discomfort due to soreness from the biopsy associated with some bruising.  Denies any skin changes, palpable masses, nipple changes or drainage prior to biopsy.  Past Medical History: Past Medical History:  Diagnosis Date   Cyst of buttocks 2014   Migraines      Past Surgical History: Past Surgical History:  Procedure Laterality Date   BREAST BIOPSY Right 08/08/2021   affirm bx, ribbon marker, path pending   EYE SURGERY Bilateral     Home Medications: Prior to Admission medications   Medication Sig Start Date End Date Taking? Authorizing Provider  butalbital-aspirin-caffeine [facility]) [phone] MG capsule Take 1 capsule by mouth every 4 (four) hours as needed for headache. 10/26/20  Yes McDonough, Lauren K, PA-C  Zoster Vaccine Adjuvanted [facility]) injection  Inject 0.5 mLs into the muscle once.   Yes [provider]    Allergies: Allergies  Allergen Reactions   Codeine Other (See Comments)    headache    Social History:  reports that she has never smoked. She has never used smokeless tobacco. She reports current alcohol use. She reports that she does not use drugs.   Family History: Family History  Problem Relation Age of Onset   Breast cancer Mother 45   Hypertension Mother    Hyperlipidemia Mother    Hypertension Father    Thyroid disease Sister     Review of Systems: Review of Systems  Constitutional:  Negative for chills and fever.  HENT:  Negative for hearing loss.   Respiratory:  Negative for shortness of breath.   Cardiovascular:  Negative for chest pain.  Gastrointestinal:  Negative for abdominal pain, nausea and vomiting.  Genitourinary:  Negative for dysuria.  Musculoskeletal:  Negative for myalgias.  Skin:  Negative for rash.  Neurological:  Negative for dizziness.  Psychiatric/Behavioral:  Negative for depression.    Physical Exam BP 110/73    Pulse 88    Temp 98.6 F (37 C) (Oral)    Ht 5\' 4"  (1.626 m)    Wt 128 lb 6.4 oz (58.2 kg)    SpO2 100%    BMI 22.04 kg/m  CONSTITUTIONAL: No acute distress, well-nourished HEENT:  Normocephalic, atraumatic, extraocular motion intact. NECK: Trachea is midline, and there is no jugular venous distension.  RESPIRATORY:  Normal respiratory effort without pathologic use  of accessory muscles. CARDIOVASCULAR: Regular rhythm and rate. BREAST: Right breast with biopsy site in the upper outer quadrant with Steri-Strips and some mild ecchymosis and firmness with tenderness to palpation.  Otherwise no other palpable masses and no other skin changes.  No right axillary lymphadenopathy.  Left breast without any palpable masses, skin changes, or nipple changes.  No left axillary lymphadenopathy. MUSCULOSKELETAL:  Normal muscle strength and tone in all four extremities.  No  peripheral edema or cyanosis. SKIN: Skin turgor is normal. There are no pathologic skin lesions.  NEUROLOGIC:  Motor and sensation is grossly normal.  Cranial nerves are grossly intact. PSYCH:  Alert and oriented to person, place and time. Affect is normal.  Laboratory Analysis: Right breast biopsy on 08/08/2021: DIAGNOSIS:  A. BREAST, RIGHT UPPER INNER QUADRANT; STEREOTACTIC CORE NEEDLE BIOPSY:  - FOCAL CHANGES COMPATIBLE WITH RADIAL SCAR.  - BACKGROUND MODERATE STROMAL FIBROSIS AND FIBROCYSTIC CHANGES.  - MICROCALCIFICATIONS ASSOCIATED WITH BENIGN MAMMARY ELEMENTS.  - NEGATIVE FOR ATYPICAL PROLIFERATIVE BREAST DISEASE.   Imaging: Right breast mammogram and ultrasound on 07/25/2021: FINDINGS: Spot compression tomosynthesis views demonstrate persistence of subtle architectural distortion in the RIGHT upper inner breast at middle depth. It is best seen on spot MLO images on spot MLO slice 23 with a favored correlate on ML slice 23. It is less conspicuous on spot CC images. There is an oval obscured mass in the RIGHT outer breast which is increased in size in comparison to prior mammograms.   On physical exam, no suspicious mass is appreciated in the upper inner breast. There is a mobile mass in the RIGHT outer breast.   Targeted ultrasound was performed of the RIGHT upper inner breast. No suspicious cystic or solid mass is seen. No definitive sonographic correlate subtle distortion is identified.   Targeted ultrasound was performed of the RIGHT outer breast. At 9 o'clock 4 cm from the nipple, there is an oval circumscribed anechoic mass with posterior acoustic enhancement. It measures 37 x 44 x 16 mm and is consistent with a benign simple cyst.   Targeted ultrasound was performed of the RIGHT axilla. No suspicious axillary lymph nodes are seen.   IMPRESSION: 1. There is subtle persistent architectural distortion in the RIGHT upper inner breast at middle depth, best seen on spot MLO and  ML imaging. This is indeterminate. Recommend stereotactic guided biopsy for definitive characterization. 2. No suspicious RIGHT axillary lymph nodes.   RECOMMENDATION: RIGHT breast stereotactic guided biopsy x1    Assessment and Plan: This is a 51 y.o. female with new diagnosis of right breast radial scar.  - Discussed with the patient that overall the pathology does not show any atypia or malignancy.  However unfortunately there is a potential risk that radial scars can hide atypical cells or malignant cells and sometimes it is recommended to excise this.  In theory, this is a benign mass and 1 potential option would be for watchful waiting with serial mammograms to continue to evaluate this area versus excision in the form of lumpectomy.  After further discussion with the patient, she has elected to proceed with a lumpectomy. - Discussed with the patient that prior to lumpectomy, given that this area is very small, the first step would be to localize this better with a radiofrequency tag placement.  Discussed with her the procedure and how this is done at the [facility] breast center.  Then the second step would be excision using this radiofrequency tag to better localize the area.  Discussed  with her that this would be an outpatient procedure and reviewed with her the surgery at length including the risks of bleeding, infection, injury to surrounding structure, postoperative activity restrictions, pain control, and she is willing to proceed. - We will schedule the patient tentatively for 09/04/2021 pending scheduling for RF tag placement.  I spent 80 minutes dedicated to the care of this patient on the date of this encounter to include pre-visit review of records, face-to-face time with the patient discussing diagnosis and management, and any post-visit coordination of care.   Melvyn Neth, [facility]

## 2021-08-13 NOTE — Progress Notes (Signed)
08/13/2021  Reason for Visit:  Right breast radial scar  Requesting Provider:  Al Pimple, RN  History of Present Illness: Denise Logan is a 51 y.o. female presenting for evaluation of right breast radial scar.  The patient had a bilateral screening mammogram on 07/18/2021 which showed an area of distortion in the right breast which was confirmed on diagnostic mammogram on 07/25/2021.  This showed a persistent distortion in the right upper inner quadrant of the breast at middle depth.  There was also an oval obscured mass in the right outer breast which on ultrasound was found to be a benign simple cyst.  This area of distortion in the upper inner quadrant was biopsied on 08/08/2021 and the results have shown focal changes compatible with radial scar with some background of stromal fibrosis and fibrocystic changes but negative for any atypia or malignancy.  The patient presents today for surgical evaluation.  The patient reports a maternal history of breast cancer.  Patient had first menses at age 89 and is G2 P2 with a first pregnancy at age 67.  She did not feel any masses in her right breast and currently she is only having some discomfort due to soreness from the biopsy associated with some bruising.  Denies any skin changes, palpable masses, nipple changes or drainage prior to biopsy.  Past Medical History: Past Medical History:  Diagnosis Date   Cyst of buttocks 2014   Migraines      Past Surgical History: Past Surgical History:  Procedure Laterality Date   BREAST BIOPSY Right 08/08/2021   affirm bx, ribbon marker, path pending   EYE SURGERY Bilateral     Home Medications: Prior to Admission medications   Medication Sig Start Date End Date Taking? Authorizing Provider  butalbital-aspirin-caffeine [facility]) [phone] MG capsule Take 1 capsule by mouth every 4 (four) hours as needed for headache. 10/26/20  Yes McDonough, Lauren K, PA-C  Zoster Vaccine Adjuvanted [facility]) injection  Inject 0.5 mLs into the muscle once.   Yes [provider]    Allergies: Allergies  Allergen Reactions   Codeine Other (See Comments)    headache    Social History:  reports that she has never smoked. She has never used smokeless tobacco. She reports current alcohol use. She reports that she does not use drugs.   Family History: Family History  Problem Relation Age of Onset   Breast cancer Mother 53   Hypertension Mother    Hyperlipidemia Mother    Hypertension Father    Thyroid disease Sister     Review of Systems: Review of Systems  Constitutional:  Negative for chills and fever.  HENT:  Negative for hearing loss.   Respiratory:  Negative for shortness of breath.   Cardiovascular:  Negative for chest pain.  Gastrointestinal:  Negative for abdominal pain, nausea and vomiting.  Genitourinary:  Negative for dysuria.  Musculoskeletal:  Negative for myalgias.  Skin:  Negative for rash.  Neurological:  Negative for dizziness.  Psychiatric/Behavioral:  Negative for depression.    Physical Exam BP 110/73    Pulse 88    Temp 98.6 F (37 C) (Oral)    Ht 5\' 4"  (1.626 m)    Wt 128 lb 6.4 oz (58.2 kg)    SpO2 100%    BMI 22.04 kg/m  CONSTITUTIONAL: No acute distress, well-nourished HEENT:  Normocephalic, atraumatic, extraocular motion intact. NECK: Trachea is midline, and there is no jugular venous distension.  RESPIRATORY:  Normal respiratory effort without pathologic use  of accessory muscles. CARDIOVASCULAR: Regular rhythm and rate. BREAST: Right breast with biopsy site in the upper outer quadrant with Steri-Strips and some mild ecchymosis and firmness with tenderness to palpation.  Otherwise no other palpable masses and no other skin changes.  No right axillary lymphadenopathy.  Left breast without any palpable masses, skin changes, or nipple changes.  No left axillary lymphadenopathy. MUSCULOSKELETAL:  Normal muscle strength and tone in all four extremities.  No  peripheral edema or cyanosis. SKIN: Skin turgor is normal. There are no pathologic skin lesions.  NEUROLOGIC:  Motor and sensation is grossly normal.  Cranial nerves are grossly intact. PSYCH:  Alert and oriented to person, place and time. Affect is normal.  Laboratory Analysis: Right breast biopsy on 08/08/2021: DIAGNOSIS:  A. BREAST, RIGHT UPPER INNER QUADRANT; STEREOTACTIC CORE NEEDLE BIOPSY:  - FOCAL CHANGES COMPATIBLE WITH RADIAL SCAR.  - BACKGROUND MODERATE STROMAL FIBROSIS AND FIBROCYSTIC CHANGES.  - MICROCALCIFICATIONS ASSOCIATED WITH BENIGN MAMMARY ELEMENTS.  - NEGATIVE FOR ATYPICAL PROLIFERATIVE BREAST DISEASE.   Imaging: Right breast mammogram and ultrasound on 07/25/2021: FINDINGS: Spot compression tomosynthesis views demonstrate persistence of subtle architectural distortion in the RIGHT upper inner breast at middle depth. It is best seen on spot MLO images on spot MLO slice 23 with a favored correlate on ML slice 23. It is less conspicuous on spot CC images. There is an oval obscured mass in the RIGHT outer breast which is increased in size in comparison to prior mammograms.   On physical exam, no suspicious mass is appreciated in the upper inner breast. There is a mobile mass in the RIGHT outer breast.   Targeted ultrasound was performed of the RIGHT upper inner breast. No suspicious cystic or solid mass is seen. No definitive sonographic correlate subtle distortion is identified.   Targeted ultrasound was performed of the RIGHT outer breast. At 9 o'clock 4 cm from the nipple, there is an oval circumscribed anechoic mass with posterior acoustic enhancement. It measures 37 x 44 x 16 mm and is consistent with a benign simple cyst.   Targeted ultrasound was performed of the RIGHT axilla. No suspicious axillary lymph nodes are seen.   IMPRESSION: 1. There is subtle persistent architectural distortion in the RIGHT upper inner breast at middle depth, best seen on spot MLO and  ML imaging. This is indeterminate. Recommend stereotactic guided biopsy for definitive characterization. 2. No suspicious RIGHT axillary lymph nodes.   RECOMMENDATION: RIGHT breast stereotactic guided biopsy x1    Assessment and Plan: This is a 51 y.o. female with new diagnosis of right breast radial scar.  - Discussed with the patient that overall the pathology does not show any atypia or malignancy.  However unfortunately there is a potential risk that radial scars can hide atypical cells or malignant cells and sometimes it is recommended to excise this.  In theory, this is a benign mass and 1 potential option would be for watchful waiting with serial mammograms to continue to evaluate this area versus excision in the form of lumpectomy.  After further discussion with the patient, she has elected to proceed with a lumpectomy. - Discussed with the patient that prior to lumpectomy, given that this area is very small, the first step would be to localize this better with a radiofrequency tag placement.  Discussed with her the procedure and how this is done at the [facility] breast center.  Then the second step would be excision using this radiofrequency tag to better localize the area.  Discussed  with her that this would be an outpatient procedure and reviewed with her the surgery at length including the risks of bleeding, infection, injury to surrounding structure, postoperative activity restrictions, pain control, and she is willing to proceed. - We will schedule the patient tentatively for 09/04/2021 pending scheduling for RF tag placement.  I spent 80 minutes dedicated to the care of this patient on the date of this encounter to include pre-visit review of records, face-to-face time with the patient discussing diagnosis and management, and any post-visit coordination of care.   Melvyn Neth, [facility]

## 2021-08-14 ENCOUNTER — Other Ambulatory Visit: Payer: Self-pay | Admitting: Surgery

## 2021-08-14 ENCOUNTER — Telehealth: Payer: Self-pay | Admitting: Surgery

## 2021-08-14 DIAGNOSIS — N6489 Other specified disorders of breast: Secondary | ICD-10-CM

## 2021-08-14 NOTE — Telephone Encounter (Signed)
Patient has been advised of Pre-Admission date/time, COVID Testing date and Surgery date.  Surgery Date: 09/04/21 Preadmission Testing Date: 08/27/21 (phone 8a-1p) Covid Testing Date: Not needed.   Patient has been made aware to call [phone], between 1-3:00pm the day before surgery, to find out what time to arrive for surgery.

## 2021-08-21 DIAGNOSIS — M25551 Pain in right hip: Secondary | ICD-10-CM | POA: Diagnosis not present

## 2021-08-21 DIAGNOSIS — M545 Low back pain, unspecified: Secondary | ICD-10-CM | POA: Diagnosis not present

## 2021-08-27 ENCOUNTER — Other Ambulatory Visit: Payer: Self-pay

## 2021-08-27 ENCOUNTER — Encounter
Admission: RE | Admit: 2021-08-27 | Discharge: 2021-08-27 | Disposition: A | Discharge status: Home / Self Care | Payer: 59 | Source: Ambulatory Visit | Attending: Surgery | Admitting: Surgery

## 2021-08-27 NOTE — Patient Instructions (Signed)
Your procedure is scheduled on: 09/04/21 Report to [place]. To find out your arrival time please call [phone] between 1PM - 3PM on 09/03/21.  Remember: Instructions that are not followed completely may result in serious medical risk, up to and including death, or upon the discretion of your surgeon and anesthesiologist your surgery may need to be rescheduled.     _X__ 1. Do not eat food after midnight the night before your procedure.                 No gum chewing or hard candies. You may drink clear liquids up to 2 hours                 before you are scheduled to arrive for your surgery- DO not drink clear                 liquids within 2 hours of the start of your surgery.                 Clear Liquids include:  water, apple juice without pulp, clear carbohydrate                 drink such as Clearfast or Gatorade, Black Coffee or Tea (Do not add                 anything to coffee or tea). Diabetics water only  __X__2.  On the morning of surgery brush your teeth with toothpaste and water, you                 may rinse your mouth with mouthwash if you wish.  Do not swallow any              toothpaste of mouthwash.     _X__ 3.  No Alcohol for 24 hours before or after surgery.   _X__ 4.  Do Not Smoke or use e-cigarettes For 24 Hours Prior to Your Surgery.                 Do not use any chewable tobacco products for at least 6 hours prior to                 surgery.  ____  5.  Bring all medications with you on the day of surgery if instructed.   __X__  6.  Notify your doctor if there is any change in your medical condition      (cold, fever, infections).     Do not wear jewelry, make-up, hairpins, clips or nail polish. Do not wear lotions, powders, or perfumes. NO DEODORANT Do not shave body hair 48 hours prior to surgery. Men may shave face and neck. Do not bring valuables to the hospital.    [facility] is not responsible  for any belongings or valuables.  Contacts, dentures/partials or body piercings may not be worn into surgery. Bring a case for your contacts, glasses or hearing aids, a denture cup will be supplied. Leave your suitcase in the car. After surgery it may be brought to your room. For patients admitted to the hospital, discharge time is determined by your treatment team.   Patients discharged the day of surgery will not be allowed to drive home.   Please read over the following fact sheets that you were given:   CHG soap  __X__ Take these medicines the morning of surgery with A SIP OF  WATER:    1. none  2.   3.   4.  5.  6.  ____ Fleet Enema (as directed)   __X__ Use CHG Soap/SAGE wipes as directed  ____ Use inhalers on the day of surgery  ____ Stop metformin/Janumet/Farxiga 2 days prior to surgery    ____ Take 1/2 of usual insulin dose the night before surgery. No insulin the morning          of surgery.   ____ Stop Blood Thinners Coumadin/Plavix/Xarelto/Pleta/Pradaxa/Eliquis/Effient/Aspirin  on   Or contact your Surgeon, Cardiologist or Medical Doctor regarding  ability to stop your blood thinners  __X__ Stop Anti-inflammatories 7 days before surgery such as Advil, Ibuprofen, Motrin,  BC or Goodies Powder, Naprosyn, Naproxen, Aleve, Aspirin    __X__ Stop all herbal supplements, fish oil or vitamin E until after surgery.    ____ Bring C-Pap to the hospital.

## 2021-08-28 ENCOUNTER — Ambulatory Visit
Admission: RE | Admit: 2021-08-28 | Discharge: 2021-08-28 | Disposition: A | Discharge status: Home / Self Care | Payer: 59 | Source: Ambulatory Visit | Attending: Surgery | Admitting: Surgery

## 2021-08-28 DIAGNOSIS — R928 Other abnormal and inconclusive findings on diagnostic imaging of breast: Secondary | ICD-10-CM | POA: Diagnosis not present

## 2021-08-28 DIAGNOSIS — N6489 Other specified disorders of breast: Secondary | ICD-10-CM | POA: Diagnosis not present

## 2021-09-04 ENCOUNTER — Ambulatory Visit
Admission: RE | Admit: 2021-09-04 | Discharge: 2021-09-04 | Disposition: A | Discharge status: Home / Self Care | Payer: 59 | Source: Ambulatory Visit | Attending: Surgery | Admitting: Surgery

## 2021-09-04 ENCOUNTER — Encounter: Payer: Self-pay | Admitting: Surgery

## 2021-09-04 ENCOUNTER — Other Ambulatory Visit: Payer: Self-pay

## 2021-09-04 ENCOUNTER — Encounter
Admission: RE | Disposition: A | Discharge status: Home / Self Care | Payer: Self-pay | Source: Home / Self Care | Attending: Surgery

## 2021-09-04 ENCOUNTER — Ambulatory Visit: Payer: 59 | Admitting: Anesthesiology

## 2021-09-04 ENCOUNTER — Ambulatory Visit
Admission: RE | Admit: 2021-09-04 | Discharge: 2021-09-04 | Disposition: A | Discharge status: Home / Self Care | Payer: 59 | Attending: Surgery | Admitting: Surgery

## 2021-09-04 DIAGNOSIS — N6489 Other specified disorders of breast: Secondary | ICD-10-CM | POA: Diagnosis not present

## 2021-09-04 DIAGNOSIS — G43909 Migraine, unspecified, not intractable, without status migrainosus: Secondary | ICD-10-CM | POA: Insufficient documentation

## 2021-09-04 DIAGNOSIS — R928 Other abnormal and inconclusive findings on diagnostic imaging of breast: Secondary | ICD-10-CM | POA: Diagnosis not present

## 2021-09-04 DIAGNOSIS — N6081 Other benign mammary dysplasias of right breast: Secondary | ICD-10-CM | POA: Diagnosis not present

## 2021-09-04 DIAGNOSIS — Z803 Family history of malignant neoplasm of breast: Secondary | ICD-10-CM | POA: Diagnosis not present

## 2021-09-04 HISTORY — DX: Cardiac murmur, unspecified: R01.1

## 2021-09-04 HISTORY — PX: BREAST LUMPECTOMY WITH RADIO FREQUENCY LOCALIZER: SHX6897

## 2021-09-04 LAB — POCT PREGNANCY, URINE: Preg Test, Ur: NEGATIVE

## 2021-09-04 SURGERY — BREAST LUMPECTOMY WITH RADIO FREQUENCY LOCALIZER
Anesthesia: General | Site: Breast | Laterality: Right

## 2021-09-04 MED ORDER — ACETAMINOPHEN 500 MG PO TABS
ORAL_TABLET | ORAL | Status: AC
  Administered 2021-09-04: 1000 mg via ORAL
  Filled 2021-09-04: qty 2

## 2021-09-04 MED ORDER — ORAL CARE MOUTH RINSE: 15.0000 mL | Freq: Once | OROMUCOSAL | Status: AC

## 2021-09-04 MED ORDER — CHLORHEXIDINE GLUCONATE CLOTH 2 % EX PADS: 6.0000 | MEDICATED_PAD | Freq: Once | CUTANEOUS | Status: DC

## 2021-09-04 MED ORDER — FENTANYL CITRATE (PF) 100 MCG/2ML IJ SOLN
INTRAMUSCULAR | Status: AC
  Filled 2021-09-04: qty 2

## 2021-09-04 MED ORDER — BUPIVACAINE-EPINEPHRINE 0.5% -1:200000 IJ SOLN
INTRAMUSCULAR | Status: DC | PRN
  Administered 2021-09-04: 30 mL

## 2021-09-04 MED ORDER — STERILE WATER FOR IRRIGATION IR SOLN
Status: DC | PRN
  Administered 2021-09-04: 500 mL

## 2021-09-04 MED ORDER — BUPIVACAINE-EPINEPHRINE (PF) 0.5% -1:200000 IJ SOLN
INTRAMUSCULAR | Status: AC
  Filled 2021-09-04: qty 30

## 2021-09-04 MED ORDER — CHLORHEXIDINE GLUCONATE 0.12 % MT SOLN: 15.0000 mL | Freq: Once | OROMUCOSAL | Status: AC

## 2021-09-04 MED ORDER — OXYCODONE HCL 5 MG PO TABS: 5.0000 mg | ORAL_TABLET | ORAL | 0 refills | Status: DC | PRN

## 2021-09-04 MED ORDER — ACETAMINOPHEN 500 MG PO TABS: 1000.0000 mg | ORAL_TABLET | Freq: Four times a day (QID) | ORAL | Status: DC | PRN

## 2021-09-04 MED ORDER — ONDANSETRON HCL 4 MG/2ML IJ SOLN
INTRAMUSCULAR | Status: AC
  Filled 2021-09-04: qty 2

## 2021-09-04 MED ORDER — DEXAMETHASONE SODIUM PHOSPHATE 10 MG/ML IJ SOLN
INTRAMUSCULAR | Status: DC | PRN
  Administered 2021-09-04: 10 mg via INTRAVENOUS

## 2021-09-04 MED ORDER — PROPOFOL 10 MG/ML IV BOLUS
INTRAVENOUS | Status: DC | PRN
  Administered 2021-09-04: 20 mg via INTRAVENOUS
  Administered 2021-09-04: 30 mg via INTRAVENOUS
  Administered 2021-09-04: 50 mg via INTRAVENOUS
  Administered 2021-09-04: 100 mg via INTRAVENOUS

## 2021-09-04 MED ORDER — MIDAZOLAM HCL 2 MG/2ML IJ SOLN
INTRAMUSCULAR | Status: AC
  Filled 2021-09-04: qty 2

## 2021-09-04 MED ORDER — CEFAZOLIN SODIUM-DEXTROSE 2-4 GM/100ML-% IV SOLN
2.0000 g | INTRAVENOUS | Status: AC
  Administered 2021-09-04: 2 g via INTRAVENOUS

## 2021-09-04 MED ORDER — KETOROLAC TROMETHAMINE 30 MG/ML IJ SOLN
INTRAMUSCULAR | Status: DC | PRN
Start: 2021-09-04 — End: 2021-09-04
  Administered 2021-09-04: 30 mg via INTRAVENOUS

## 2021-09-04 MED ORDER — ACETAMINOPHEN 500 MG PO TABS: 1000.0000 mg | ORAL_TABLET | ORAL | Status: AC

## 2021-09-04 MED ORDER — FENTANYL CITRATE (PF) 100 MCG/2ML IJ SOLN
INTRAMUSCULAR | Status: DC | PRN
  Administered 2021-09-04 (×4): 25 ug via INTRAVENOUS

## 2021-09-04 MED ORDER — IBUPROFEN 600 MG PO TABS: 600.0000 mg | ORAL_TABLET | Freq: Three times a day (TID) | ORAL | 1 refills | Status: DC | PRN

## 2021-09-04 MED ORDER — EPHEDRINE SULFATE (PRESSORS) 50 MG/ML IJ SOLN
INTRAMUSCULAR | Status: DC | PRN
  Administered 2021-09-04: 10 mg via INTRAVENOUS
  Administered 2021-09-04: 5 mg via INTRAVENOUS
  Administered 2021-09-04: 10 mg via INTRAVENOUS

## 2021-09-04 MED ORDER — OXYCODONE HCL 5 MG PO TABS
ORAL_TABLET | ORAL | Status: AC
  Administered 2021-09-04: 5 mg via ORAL
  Filled 2021-09-04: qty 1

## 2021-09-04 MED ORDER — MIDAZOLAM HCL 2 MG/2ML IJ SOLN
INTRAMUSCULAR | Status: DC | PRN
  Administered 2021-09-04: 2 mg via INTRAVENOUS

## 2021-09-04 MED ORDER — GABAPENTIN 300 MG PO CAPS
ORAL_CAPSULE | ORAL | Status: AC
  Administered 2021-09-04: 300 mg via ORAL
  Filled 2021-09-04: qty 1

## 2021-09-04 MED ORDER — OXYCODONE HCL 5 MG PO TABS: 5.0000 mg | ORAL_TABLET | ORAL | Status: DC | PRN

## 2021-09-04 MED ORDER — LACTATED RINGERS IV SOLN
INTRAVENOUS | Status: DC
Start: 2021-09-04 — End: 2021-09-04

## 2021-09-04 MED ORDER — PROPOFOL 10 MG/ML IV BOLUS
INTRAVENOUS | Status: AC
  Filled 2021-09-04: qty 20

## 2021-09-04 MED ORDER — GABAPENTIN 300 MG PO CAPS: 300.0000 mg | ORAL_CAPSULE | ORAL | Status: AC

## 2021-09-04 MED ORDER — FENTANYL CITRATE (PF) 100 MCG/2ML IJ SOLN: 25.0000 ug | INTRAMUSCULAR | Status: DC | PRN

## 2021-09-04 MED ORDER — ONDANSETRON HCL 4 MG/2ML IJ SOLN: 4.0000 mg | Freq: Once | INTRAMUSCULAR | Status: DC | PRN

## 2021-09-04 MED ORDER — LIDOCAINE HCL (CARDIAC) PF 100 MG/5ML IV SOSY
PREFILLED_SYRINGE | INTRAVENOUS | Status: DC | PRN
Start: 2021-09-04 — End: 2021-09-04
  Administered 2021-09-04: 50 mg via INTRAVENOUS

## 2021-09-04 MED ORDER — CHLORHEXIDINE GLUCONATE 0.12 % MT SOLN
OROMUCOSAL | Status: AC
  Administered 2021-09-04: 15 mL via OROMUCOSAL
  Filled 2021-09-04: qty 15

## 2021-09-04 MED ORDER — FAMOTIDINE 20 MG PO TABS: 20.0000 mg | ORAL_TABLET | Freq: Once | ORAL | Status: AC

## 2021-09-04 MED ORDER — LIDOCAINE HCL (PF) 2 % IJ SOLN
INTRAMUSCULAR | Status: AC
  Filled 2021-09-04: qty 5

## 2021-09-04 MED ORDER — CEFAZOLIN SODIUM-DEXTROSE 2-4 GM/100ML-% IV SOLN
INTRAVENOUS | Status: AC
  Filled 2021-09-04: qty 100

## 2021-09-04 MED ORDER — FAMOTIDINE 20 MG PO TABS
ORAL_TABLET | ORAL | Status: AC
  Administered 2021-09-04: 20 mg via ORAL
  Filled 2021-09-04: qty 1

## 2021-09-04 SURGICAL SUPPLY — 59 items
ADH SKN CLS APL DERMABOND .7 (GAUZE/BANDAGES/DRESSINGS) ×1
APL PRP STRL LF DISP 70% ISPRP (MISCELLANEOUS) ×1
APPLIER CLIP 9.375 SM OPEN (CLIP)
APR CLP SM 9.3 20 MLT OPN (CLIP)
BINDER BREAST LRG (GAUZE/BANDAGES/DRESSINGS) IMPLANT
BINDER BREAST MEDIUM (GAUZE/BANDAGES/DRESSINGS) ×1 IMPLANT
BLADE PHOTON ILLUMINATED (MISCELLANEOUS) ×2 IMPLANT
BLADE SURG 15 STRL LF DISP TIS (BLADE) ×2 IMPLANT
BLADE SURG 15 STRL SS (BLADE) ×4
CHLORAPREP W/TINT 26 (MISCELLANEOUS) ×2 IMPLANT
CLIP APPLIE 9.375 SM OPEN (CLIP) IMPLANT
CNTNR SPEC 2.5X3XGRAD LEK (MISCELLANEOUS)
CONT SPEC 4OZ STER OR WHT (MISCELLANEOUS)
CONT SPEC 4OZ STRL OR WHT (MISCELLANEOUS)
CONTAINER SPEC 2.5X3XGRAD LEK (MISCELLANEOUS) IMPLANT
COVER PROBE FLX POLY STRL (MISCELLANEOUS) ×2 IMPLANT
DERMABOND ADVANCED (GAUZE/BANDAGES/DRESSINGS) ×1
DERMABOND ADVANCED .7 DNX12 (GAUZE/BANDAGES/DRESSINGS) ×1 IMPLANT
DEVICE DUBIN SPECIMEN MAMMOGRA (MISCELLANEOUS) ×2 IMPLANT
DRAPE LAPAROTOMY 100X77 ABD (DRAPES) ×2 IMPLANT
DRSG GAUZE FLUFF 36X18 (GAUZE/BANDAGES/DRESSINGS) ×2 IMPLANT
ELECT CAUTERY BLADE TIP 2.5 (TIP) ×2
ELECT REM PT RETURN 9FT ADLT (ELECTROSURGICAL) ×2
ELECTRODE CAUTERY BLDE TIP 2.5 (TIP) ×1 IMPLANT
ELECTRODE REM PT RTRN 9FT ADLT (ELECTROSURGICAL) ×1 IMPLANT
GAUZE 4X4 16PLY ~~LOC~~+RFID DBL (SPONGE) ×2 IMPLANT
GLOVE SURG SYN 7.0 (GLOVE) ×2 IMPLANT
GLOVE SURG SYN 7.0 PF PI (GLOVE) ×1 IMPLANT
GLOVE SURG SYN 7.5  E (GLOVE) ×1
GLOVE SURG SYN 7.5 E (GLOVE) ×1 IMPLANT
GLOVE SURG SYN 7.5 PF PI (GLOVE) ×1 IMPLANT
GOWN STRL REUS W/ TWL LRG LVL3 (GOWN DISPOSABLE) ×2 IMPLANT
GOWN STRL REUS W/TWL LRG LVL3 (GOWN DISPOSABLE) ×4
KIT MARKER MARGIN INK (KITS) ×1 IMPLANT
KIT TURNOVER KIT A (KITS) ×2 IMPLANT
LABEL OR SOLS (LABEL) ×2 IMPLANT
MANIFOLD NEPTUNE II (INSTRUMENTS) ×2 IMPLANT
MARGIN MAP 10MM (MISCELLANEOUS) IMPLANT
MARKER MARGIN CORRECT CLIP (MARKER) IMPLANT
NDL HYPO 25X1 1.5 SAFETY (NEEDLE) ×1 IMPLANT
NEEDLE HYPO 22GX1.5 SAFETY (NEEDLE) ×2 IMPLANT
NEEDLE HYPO 25X1 1.5 SAFETY (NEEDLE) ×2 IMPLANT
PACK BASIN MINOR ARMC (MISCELLANEOUS) ×2 IMPLANT
SET LOCALIZER 20 PROBE US (MISCELLANEOUS) ×2 IMPLANT
SUT ETHILON 3-0 FS-10 30 BLK (SUTURE) ×2
SUT MNCRL 4-0 (SUTURE) ×2
SUT MNCRL 4-0 27XMFL (SUTURE) ×1
SUT SILK 3 0 SH 30 (SUTURE) ×1 IMPLANT
SUT SILK 3-0 (SUTURE) ×1 IMPLANT
SUT VIC AB 3-0 SH 27 (SUTURE) ×2
SUT VIC AB 3-0 SH 27X BRD (SUTURE) ×1 IMPLANT
SUTURE EHLN 3-0 FS-10 30 BLK (SUTURE) IMPLANT
SUTURE MNCRL 4-0 27XMF (SUTURE) ×1 IMPLANT
SYR 10ML LL (SYRINGE) ×2 IMPLANT
SYR BULB IRRIG 60ML STRL (SYRINGE) ×2 IMPLANT
TAPE TRANSPORE STRL 2 31045 (GAUZE/BANDAGES/DRESSINGS) IMPLANT
TRAP NEPTUNE SPECIMEN COLLECT (MISCELLANEOUS) ×2 IMPLANT
WATER STERILE IRR 1000ML POUR (IV SOLUTION) ×2 IMPLANT
WATER STERILE IRR 500ML POUR (IV SOLUTION) ×2 IMPLANT

## 2021-09-04 NOTE — Transfer of Care (Signed)
Immediate Anesthesia Transfer of Care Note ? ?Patient: Denise Logan ? ?Procedure(s) Performed: BREAST LUMPECTOMY WITH RADIO FREQUENCY LOCALIZER (Right: Breast) ? ?Patient Location: PACU ? ?Anesthesia Type:General ? ?Level of Consciousness: drowsy ? ?Airway & Oxygen Therapy: Patient Spontanous Breathing and Patient connected to face mask oxygen ? ?Post-op Assessment: Report given to RN and Post -op Vital signs reviewed and stable ? ?Post vital signs: Reviewed and stable ? ?Last Vitals:  ?Vitals Value Taken Time  ?BP 113/66 09/04/21 0909  ?Temp 36.1 ?C 09/04/21 0909  ?Pulse 85 09/04/21 0910  ?Resp 13 09/04/21 0910  ?SpO2 99 % 09/04/21 0910  ?Vitals shown include unvalidated device data. ? ?Last Pain:  ?Vitals:  ? 09/04/21 0634  ?TempSrc: Oral  ?   ? ?  ? ?Complications: No notable events documented. ?

## 2021-09-04 NOTE — Interval H&P Note (Signed)
History and Physical Interval Note: ? ?09/04/2021 ?7:21 AM ? ?Denise Logan  has presented today for surgery, with the diagnosis of Right breast radial scar.  The various methods of treatment have been discussed with the patient and family. After consideration of risks, benefits and other options for treatment, the patient has consented to  Procedure(s): ?BREAST LUMPECTOMY WITH RADIO FREQUENCY LOCALIZER (Right) as a surgical intervention.  The patient's history has been reviewed, patient examined, no change in status, stable for surgery.  I have reviewed the patient's chart and labs.  Questions were answered to the patient's satisfaction.   ? ? ?Denise Logan ? ? ?

## 2021-09-04 NOTE — Anesthesia Procedure Notes (Signed)
Procedure Name: LMA Insertion ?Date/Time: 09/04/2021 7:34 AM ?Performed by: Tollie Eth, CRNA ?Pre-anesthesia Checklist: Patient identified, Patient being monitored, Timeout performed, Emergency Drugs available and Suction available ?Patient Re-evaluated:Patient Re-evaluated prior to induction ?Oxygen Delivery Method: Circle system utilized ?Preoxygenation: Pre-oxygenation with 100% oxygen ?Induction Type: IV induction ?Ventilation: Mask ventilation without difficulty ?LMA: LMA inserted ?LMA Size: 3.0 ?Tube type: Oral ?Number of attempts: 1 ?Placement Confirmation: positive ETCO2 and breath sounds checked- equal and bilateral ?Tube secured with: Tape ?Dental Injury: Teeth and Oropharynx as per pre-operative assessment  ? ? ? ? ?

## 2021-09-04 NOTE — Op Note (Signed)
?  Procedure Date:  09/04/2021 ? ?Pre-operative Diagnosis:  Right breast radial scar ? ?Post-operative Diagnosis: Right breast radial scar ? ?Procedure:  Right breast RF tag-localized lumpectomy ? ?Surgeon:  Melvyn Neth, MD ? ?Anesthesia:  General endotracheal ? ?Assistant:  William Hamburger, PA-S ? ?Estimated Blood Loss:  5 ml ? ?Specimens:   ?Right breast radial scar ?Right breast new inferior/posterior margin ? ?Complications:  None ? ?Indications for Procedure:  This is a 51 y.o. female who presents with an area of distortion in the right breast, biopsy showing radial scar.  The risks of bleeding, infection, injury to surrounding structures, hematoma, seroma, open wound, cosmetic deformity, and the need for further surgery were all discussed with the patient and was willing to proceed.  Prior to this procedure, the patient had undergone RF tag localization. ? ?Description of Procedure: ?The patient was correctly identified in the preoperative area and brought into the operating room.  The patient was placed supine with VTE prophylaxis in place.  Appropriate time-outs were performed.  Anesthesia was induced and the patient was intubated.  Appropriate antibiotics were infused. ? ?The right chest was prepped and draped in usual sterile fashion.  The RF tag localization site was determined using the Hologic probe.  An incision was made overlying the tag and clip.  Hologic probe was used to guide our dissection using electrocautery, and a partial mastectomy was performed with adequate margins.  At the inferior/posterior portion, it was noted that during dissection the RF tag partially fell off the specimen, so as a precaution, a new segment of posterior/inferior margin was taken to assure appropriate margins around the clip and tag.  MarginMarker was used to ink each of the sides of the specimens.  The main specimen was then imaged to confirm that the area of concern, biopsy clip, and RF tag were included in the  excision.  Both were then sent to pathology.  The cavity was irrigated and hemostasis was assured with electrocautery.  Local anesthetic was infiltrated into the skin and subcutaneous tissue of the cavity.  The wound was then closed in three layers with 3-0 Vicryl and 4-0 Monocryl and sealed with DermaBond. ? ?The patient was emerged from anesthesia and extubated and brought to the recovery room for further management. ? ?The patient tolerated the procedure well and all counts were correct at the end of the case. ? ? ?Melvyn Neth, MD  ?

## 2021-09-04 NOTE — Anesthesia Preprocedure Evaluation (Signed)
Anesthesia Evaluation  ?Patient identified by MRN, date of birth, ID band ?Patient awake ? ? ? ?Reviewed: ?Allergy & Precautions, NPO status , Patient's Chart, lab work & pertinent test results ? ?Airway ?Mallampati: II ? ?TM Distance: >3 FB ?Neck ROM: Full ? ? ? Dental ? ?(+) Teeth Intact ?  ?Pulmonary ?neg pulmonary ROS,  ?  ?Pulmonary exam normal ?breath sounds clear to auscultation ? ? ? ? ? ? Cardiovascular ?Exercise Tolerance: Good ?negative cardio ROS ?Normal cardiovascular exam ?Rhythm:Regular Rate:Normal ? ? ?  ?Neuro/Psych ? Headaches, negative neurological ROS ? negative psych ROS  ? GI/Hepatic ?negative GI ROS, Neg liver ROS,   ?Endo/Other  ?negative endocrine ROS ? Renal/GU ?negative Renal ROS  ?negative genitourinary ?  ?Musculoskeletal ? ?(+) Arthritis ,  ? Abdominal ?Normal abdominal exam  (+)   ?Peds ?negative pediatric ROS ?(+)  Hematology ?negative hematology ROS ?(+)   ?Anesthesia Other Findings ?Past Medical History: ?2014: Cyst of buttocks ?No date: Heart murmur ?No date: Migraines ? ?Past Surgical History: ?08/08/2021: BREAST BIOPSY; Right ?    Comment:  affirm bx, ribbon marker,COMPATIBLE WITH RADIAL SCAR ?No date: EYE SURGERY; Bilateral ?    Comment:  lasik ? ?BMI   ? Body Mass Index: 21.97 kg/m?  ?  ? ? Reproductive/Obstetrics ?negative OB ROS ? ?  ? ? ? ? ? ? ? ? ? ? ? ? ? ?  ?  ? ? ? ? ? ? ? ? ?Anesthesia Physical ?Anesthesia Plan ? ?ASA: 2 ? ?Anesthesia Plan: General  ? ?Post-op Pain Management:   ? ?Induction: Intravenous ? ?PONV Risk Score and Plan: 1 and Ondansetron and Dexamethasone ? ?Airway Management Planned: LMA ? ?Additional Equipment:  ? ?Intra-op Plan:  ? ?Post-operative Plan: Extubation in OR ? ?Informed Consent: I have reviewed the patients History and Physical, chart, labs and discussed the procedure including the risks, benefits and alternatives for the proposed anesthesia with the patient or authorized representative who has indicated  his/her understanding and acceptance.  ? ? ? ?Dental Advisory Given ? ?Plan Discussed with: CRNA and Surgeon ? ?Anesthesia Plan Comments:   ? ? ? ? ? ? ?Anesthesia Quick Evaluation ? ?

## 2021-09-04 NOTE — Anesthesia Postprocedure Evaluation (Signed)
Anesthesia Post Note ? ?Patient: Denise Logan ? ?Procedure(s) Performed: BREAST LUMPECTOMY WITH RADIO FREQUENCY LOCALIZER (Right: Breast) ? ?Patient location during evaluation: PACU ?Anesthesia Type: General ?Level of consciousness: awake and oriented ?Pain management: satisfactory to patient ?Vital Signs Assessment: post-procedure vital signs reviewed and stable ?Respiratory status: spontaneous breathing and respiratory function stable ?Cardiovascular status: stable ?Anesthetic complications: no ? ? ?No notable events documented. ? ? ?Last Vitals:  ?Vitals:  ? 09/04/21 0915 09/04/21 0932  ?BP: 110/65 110/70  ?Pulse: 86 81  ?Resp: 12 13  ?Temp:    ?SpO2: 99% 100%  ?  ?Last Pain:  ?Vitals:  ? 09/04/21 0932  ?TempSrc:   ?PainSc: 0-No pain  ? ? ?  ?  ?  ?  ?  ?  ? ?VAN STAVEREN,Marcell Chavarin ? ? ? ? ?

## 2021-09-04 NOTE — Discharge Instructions (Signed)
AMBULATORY SURGERY  ?DISCHARGE INSTRUCTIONS ? ? ?The drugs that you were given will stay in your system until tomorrow so for the next 24 hours you should not: ? ?Drive an automobile ?Make any legal decisions ?Drink any alcoholic beverage ? ? ?You may resume regular meals tomorrow.  Today it is better to start with liquids and gradually work up to solid foods. ? ?You may eat anything you prefer, but it is better to start with liquids, then soup and crackers, and gradually work up to solid foods. ? ? ?Please notify your doctor immediately if you have any unusual bleeding, trouble breathing, redness and pain at the surgery site, drainage, fever, or pain not relieved by medication. ? ? ? ?Additional Instructions: ? ? ? ?Please contact your physician with any problems or Same Day Surgery at [phone], Monday through Friday 6 am to 4 pm, or [place] [facility] number at [phone].  ?

## 2021-09-06 LAB — SURGICAL PATHOLOGY

## 2021-09-07 NOTE — Progress Notes (Signed)
09/07/20 ? ?Pathology results reviewed and discussed with the patient today via phone conversation.  No atypia or malignancy.  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 ? ?Olean Ree, MD ?

## 2021-09-10 ENCOUNTER — Ambulatory Visit (INDEPENDENT_AMBULATORY_CARE_PROVIDER_SITE_OTHER): Payer: 59

## 2021-09-10 ENCOUNTER — Other Ambulatory Visit: Payer: Self-pay

## 2021-09-10 DIAGNOSIS — N6489 Other specified disorders of breast: Secondary | ICD-10-CM

## 2021-09-10 NOTE — Patient Instructions (Signed)
May use Benadryl tablets for any itching. May also apply Cortizone cream to rash, stay away from the incision site. Follow up as scheduled.  ?

## 2021-09-10 NOTE — Progress Notes (Signed)
Patient ID: Denise Logan, female   DOB: 1971-03-25, 51 y.o.   MRN: [id number] ?Patient came in for a wound check. She states that she has had a rash around her R arm and some on her R chest area. Just mild itching noted and some small bumps. She has not taken the Oxycodone and she has showered. Advised patient to use Benadryl as needed and may apply Cortizone cream, but to avoid the surgical area. Surgical site looks clean with no redness or drainage. Follow up as scheduled.  ?

## 2021-09-11 ENCOUNTER — Telehealth: Payer: Self-pay

## 2021-09-11 NOTE — Telephone Encounter (Signed)
Patient is calling and is asking if she has any restrictions for driving I let her know as long as shes not on any narcotics. Please advise ? ?

## 2021-09-11 NOTE — Telephone Encounter (Signed)
Patient notified she may drive as long as she is not taking any narcotic pain medication and she is not in pain where she could not comfortable look over both of her shoulders. Patient is aware.  ?

## 2021-09-18 DIAGNOSIS — M5441 Lumbago with sciatica, right side: Secondary | ICD-10-CM | POA: Diagnosis not present

## 2021-09-18 DIAGNOSIS — M62838 Other muscle spasm: Secondary | ICD-10-CM | POA: Diagnosis not present

## 2021-09-18 DIAGNOSIS — M533 Sacrococcygeal disorders, not elsewhere classified: Secondary | ICD-10-CM | POA: Diagnosis not present

## 2021-09-18 DIAGNOSIS — G8929 Other chronic pain: Secondary | ICD-10-CM | POA: Diagnosis not present

## 2021-09-19 ENCOUNTER — Other Ambulatory Visit: Payer: Self-pay

## 2021-09-19 ENCOUNTER — Ambulatory Visit (INDEPENDENT_AMBULATORY_CARE_PROVIDER_SITE_OTHER): Payer: 59 | Admitting: Surgery

## 2021-09-19 ENCOUNTER — Encounter: Payer: Self-pay | Admitting: Surgery

## 2021-09-19 VITALS — BP 114/74 | HR 80 | Temp 97.8°F | Ht 64.0 in | Wt 129.0 lb

## 2021-09-19 DIAGNOSIS — N6489 Other specified disorders of breast: Secondary | ICD-10-CM

## 2021-09-19 DIAGNOSIS — Z09 Encounter for follow-up examination after completed treatment for conditions other than malignant neoplasm: Secondary | ICD-10-CM

## 2021-09-19 NOTE — Progress Notes (Signed)
09/19/2021 ? ?HPI: ?Kamile Fassler is a 51 y.o. female s/p right breast lumpectomy on 09/04/21.  Pathology showed [place], with otherwise no atypia or malignancy.  Patient reports she's doing well, with some appropriate soreness but no worsening pain.  She's trying to do some activity with her right arm but is avoiding any strenuous activity. ? ?Vital signs: ?BP 114/74   Pulse 80   Temp 97.8 ?F (36.6 ?C) (Oral)   Ht '5\' 4"'$  (1.626 m)   Wt 129 lb (58.5 kg)   SpO2 99%   BMI 22.14 kg/m?   ? ?Physical Exam: ?Constitutional:  No acute distress ?Breast:  Right breast incision is healing well, clean, dry, intact.  The medial corner perhaps has a less than 1 mm dehiscence, but no drainage noted.  No erythema or induration. ? ?Assessment/Plan: ?This is a 51 y.o. female s/p right breast lumpectomy ? ?--Patient is healing well without significant issues. ?--Discussed pathology results and that [place] is a benign finding, without increased risk of breast cancer. ?--Patient should continue her yearly mammograms, but I think at this point given the benign finding, is appropriate to follow with her PCP for further/future mammograms.  Discussed with her that we'd be readily available if any future issues. ?--Follow up as needed. ? ? ?Melvyn Neth, MD ?[facility]  ?

## 2021-09-19 NOTE — Patient Instructions (Signed)
Please call our office if you have any questions or concerns. Please follow up with your primary care provider for future mammograms. ?

## 2021-11-01 ENCOUNTER — Ambulatory Visit (INDEPENDENT_AMBULATORY_CARE_PROVIDER_SITE_OTHER): Payer: 59 | Admitting: Physician Assistant

## 2021-11-01 ENCOUNTER — Other Ambulatory Visit
Admission: RE | Admit: 2021-11-01 | Discharge: 2021-11-01 | Disposition: A | Discharge status: Home / Self Care | Payer: 59 | Source: Ambulatory Visit | Attending: Physician Assistant | Admitting: Physician Assistant

## 2021-11-01 ENCOUNTER — Encounter: Payer: Self-pay | Admitting: Physician Assistant

## 2021-11-01 DIAGNOSIS — E538 Deficiency of other specified B group vitamins: Secondary | ICD-10-CM

## 2021-11-01 DIAGNOSIS — N6489 Other specified disorders of breast: Secondary | ICD-10-CM

## 2021-11-01 DIAGNOSIS — Z01419 Encounter for gynecological examination (general) (routine) without abnormal findings: Secondary | ICD-10-CM | POA: Diagnosis not present

## 2021-11-01 DIAGNOSIS — R7989 Other specified abnormal findings of blood chemistry: Secondary | ICD-10-CM

## 2021-11-01 DIAGNOSIS — E559 Vitamin D deficiency, unspecified: Secondary | ICD-10-CM | POA: Insufficient documentation

## 2021-11-01 DIAGNOSIS — R5383 Other fatigue: Secondary | ICD-10-CM | POA: Diagnosis not present

## 2021-11-01 DIAGNOSIS — G8929 Other chronic pain: Secondary | ICD-10-CM

## 2021-11-01 DIAGNOSIS — R300 Dysuria: Secondary | ICD-10-CM | POA: Diagnosis not present

## 2021-11-01 DIAGNOSIS — E7800 Pure hypercholesterolemia, unspecified: Secondary | ICD-10-CM | POA: Insufficient documentation

## 2021-11-01 DIAGNOSIS — M5441 Lumbago with sciatica, right side: Secondary | ICD-10-CM | POA: Diagnosis not present

## 2021-11-01 DIAGNOSIS — Z0001 Encounter for general adult medical examination with abnormal findings: Secondary | ICD-10-CM | POA: Diagnosis not present

## 2021-11-01 LAB — CBC WITH DIFFERENTIAL/PLATELET
Abs Immature Granulocytes: 0.01 10*3/uL (ref 0.00–0.07)
Basophils Absolute: 0 10*3/uL (ref 0.0–0.1)
Basophils Relative: 0 %
Eosinophils Absolute: 0.1 10*3/uL (ref 0.0–0.5)
Eosinophils Relative: 1 %
HCT: 41.3 % (ref 36.0–46.0)
Hemoglobin: 13.4 g/dL (ref 12.0–15.0)
Immature Granulocytes: 0 %
Lymphocytes Relative: 30 %
Lymphs Abs: 1.6 10*3/uL (ref 0.7–4.0)
MCH: 27 pg (ref 26.0–34.0)
MCHC: 32.4 g/dL (ref 30.0–36.0)
MCV: 83.3 fL (ref 80.0–100.0)
Monocytes Absolute: 0.5 10*3/uL (ref 0.1–1.0)
Monocytes Relative: 9 %
Neutro Abs: 3.2 10*3/uL (ref 1.7–7.7)
Neutrophils Relative %: 60 %
Platelets: 283 10*3/uL (ref 150–400)
RBC: 4.96 MIL/uL (ref 3.87–5.11)
RDW: 13.3 % (ref 11.5–15.5)
WBC: 5.3 10*3/uL (ref 4.0–10.5)
nRBC: 0 % (ref 0.0–0.2)

## 2021-11-01 LAB — COMPREHENSIVE METABOLIC PANEL
ALT: 21 U/L (ref 0–44)
AST: 26 U/L (ref 15–41)
Albumin: 4.5 g/dL (ref 3.5–5.0)
Alkaline Phosphatase: 61 U/L (ref 38–126)
Anion gap: 9 (ref 5–15)
BUN: 9 mg/dL (ref 6–20)
CO2: 25 mmol/L (ref 22–32)
Calcium: 9.3 mg/dL (ref 8.9–10.3)
Chloride: 104 mmol/L (ref 98–111)
Creatinine, Ser: 0.62 mg/dL (ref 0.44–1.00)
GFR, Estimated: >60 mL/min (ref >60)
Glucose, Bld: 90 mg/dL (ref 70–99)
Potassium: 4.4 mmol/L (ref 3.5–5.1)
Sodium: 138 mmol/L (ref 135–145)
Total Bilirubin: 1 mg/dL (ref 0.3–1.2)
Total Protein: 8.3 g/dL — ABNORMAL HIGH (ref 6.5–8.1)

## 2021-11-01 LAB — LIPID PANEL
Cholesterol: 261 mg/dL — ABNORMAL HIGH (ref 0–200)
HDL: 83 mg/dL (ref >40)
LDL Cholesterol: 156 mg/dL — ABNORMAL HIGH (ref 0–99)
Total CHOL/HDL Ratio: 3.1 RATIO
Triglycerides: 109 mg/dL (ref <150)
VLDL: 22 mg/dL (ref 0–40)

## 2021-11-01 LAB — VITAMIN D 25 HYDROXY (VIT D DEFICIENCY, FRACTURES): Vit D, 25-Hydroxy: 13.99 ng/mL — ABNORMAL LOW (ref 30–100)

## 2021-11-01 LAB — T4, FREE: Free T4: 0.71 ng/dL (ref 0.61–1.12)

## 2021-11-01 LAB — VITAMIN B12: Vitamin B-12: 388 pg/mL (ref 180–914)

## 2021-11-01 LAB — TSH: TSH: 1.222 u[IU]/mL (ref 0.350–4.500)

## 2021-11-01 LAB — FOLATE: Folate: 15.8 ng/mL (ref >5.9)

## 2021-11-01 NOTE — Progress Notes (Signed)
?[facility] ?[address] ?[place], [place] [address] ? ?Internal MEDICINE  ?Office Visit Note ? ?Patient Name: Denise Logan ? [id number]  ?[id number] ? ?Date of Service: 11/04/2021 ? ?Chief Complaint  ?Patient presents with  ? Annual Exam  ? ? ? ?HPI ?Pt is here for routine health maintenance examination and is doing well ?-Doing well post lumpectomy, still a little sore though. Reports look good and is recommended to continue routine annual screenings now ?-Did see ortho and they sent her to PT for her back and it helped some, but still there. She had follow up to discuss additional testing and treatments such as steroid injection but she declined for now and will follow up as needed since she is managing on her own ?-Migraines just once in awhile, takes fiorinal only if needed ?-has not eaten or had water today since she thought she may need to do labs. She is due for routine fasting labs. Does not have any dizziness or lightheadedness and normally has a relatively low BP ?Scar over right breast with some tenderness for right breast since lumpectomy ? ? ?Current Medication: ?Outpatient Encounter Medications as of 11/01/2021  ?Medication Sig  ? acetaminophen (TYLENOL) 500 MG tablet Take 2 tablets (1,000 mg total) by mouth every 6 (six) hours as needed for mild pain.  ? butalbital-aspirin-caffeine (FIORINAL) 50-325-40 MG capsule Take 1 capsule by mouth every 4 (four) hours as needed for headache.  ? ibuprofen (ADVIL) 600 MG tablet Take 1 tablet (600 mg total) by mouth every 8 (eight) hours as needed for moderate pain.  ? ?No facility-administered encounter medications on file as of 11/01/2021.  ? ? ?Surgical History: ?Past Surgical History:  ?Procedure Laterality Date  ? BREAST BIOPSY Right 08/08/2021  ? affirm bx, ribbon marker,COMPATIBLE WITH RADIAL SCAR  ? BREAST LUMPECTOMY WITH RADIO FREQUENCY LOCALIZER Right 09/04/2021  ? Procedure: BREAST LUMPECTOMY WITH RADIO FREQUENCY LOCALIZER;  Surgeon: Olean Ree,  MD;  Location: [facility] ORS;  Service: General;  Laterality: Right;  ? EYE SURGERY Bilateral   ? lasik  ? ? ?Medical History: ?Past Medical History:  ?Diagnosis Date  ? Cyst of buttocks 2014  ? Heart murmur   ? Migraines   ? ? ?Family History: ?Family History  ?Problem Relation Age of Onset  ? Breast cancer Mother 55  ? Hypertension Mother   ? Hyperlipidemia Mother   ? Hypertension Father   ? Thyroid disease Sister   ? ? ? ? ?Review of Systems  ?Constitutional:  Negative for chills, fatigue and unexpected weight change.  ?HENT:  Negative for congestion, postnasal drip, rhinorrhea, sneezing and sore throat.   ?Eyes:  Negative for redness.  ?Respiratory:  Negative for cough, chest tightness and shortness of breath.   ?Cardiovascular:  Negative for chest pain and palpitations.  ?Gastrointestinal:  Negative for abdominal pain, constipation, diarrhea, nausea and vomiting.  ?Genitourinary:  Negative for dysuria and frequency.  ?Musculoskeletal:  Positive for arthralgias and back pain. Negative for joint swelling and neck pain.  ?Skin:  Negative for rash.  ?Neurological: Negative.  Negative for tremors and numbness.  ?Hematological:  Negative for adenopathy. Does not bruise/bleed easily.  ?Psychiatric/Behavioral:  Negative for behavioral problems (Depression), sleep disturbance and suicidal ideas. The patient is not nervous/anxious.   ? ? ?Vital Signs: ?BP 98/70 Comment: 95/56  Pulse 83   Temp 97.6 ?F (36.4 ?C)   Resp 16   Ht '5\' 4"'$  (1.626 m)   Wt 130 lb (59 kg)   SpO2  99%   BMI 22.31 kg/m?  ? ? ?Physical Exam ?Vitals and nursing note reviewed.  ?Constitutional:   ?   General: She is not in acute distress. ?   Appearance: She is well-developed and normal weight. She is not diaphoretic.  ?HENT:  ?   Head: Normocephalic and atraumatic.  ?   Mouth/Throat:  ?   Pharynx: No oropharyngeal exudate.  ?Eyes:  ?   Pupils: Pupils are equal, round, and reactive to light.  ?Neck:  ?   Thyroid: No thyromegaly.  ?   Vascular: No JVD.   ?   Trachea: No tracheal deviation.  ?Cardiovascular:  ?   Rate and Rhythm: Normal rate and regular rhythm.  ?   Heart sounds: Normal heart sounds. No murmur heard. ?  No friction rub. No gallop.  ?Pulmonary:  ?   Effort: Pulmonary effort is normal. No respiratory distress.  ?   Breath sounds: No wheezing or rales.  ?Chest:  ?   Chest wall: No tenderness.  ?Breasts: ?   Right: Tenderness present. No mass.  ?   Left: Tenderness present. No mass.  ?   Comments: Scar over right breast s/p lumpectomy with residual tenderness of chest and breasts ?Abdominal:  ?   General: Bowel sounds are normal.  ?   Palpations: Abdomen is soft.  ?Musculoskeletal:     ?   General: Normal range of motion.  ?   Cervical back: Normal range of motion and neck supple.  ?   Right lower leg: No edema.  ?   Left lower leg: No edema.  ?Lymphadenopathy:  ?   Cervical: No cervical adenopathy.  ?Skin: ?   General: Skin is warm and dry.  ?Neurological:  ?   Mental Status: She is alert and oriented to person, place, and time.  ?   Cranial Nerves: No cranial nerve deficit.  ?Psychiatric:     ?   Behavior: Behavior normal.     ?   Thought Content: Thought content normal.     ?   Judgment: Judgment normal.  ? ? ? ?LABS: ?Recent Results (from the past 2160 hour(s))  ?Surgical pathology     Status: None  ? Collection Time: 08/08/21 10:53 AM  ?Result Value Ref Range  ? SURGICAL PATHOLOGY    ?  SURGICAL PATHOLOGY ?CASE: [id number] ?PATIENT: Denise Logan ?Surgical Pathology Report ? ? ? ? ?Specimen Submitted: ?A. Breast, right ? ?Clinical History: Distortion.  R/O radial scar vs IMC. Ribbon-shaped ?clip placed following stereotactic biopsy of RIGHT breast, upper inner ?quadrant. ? ? ? ?DIAGNOSIS: ?A. BREAST, RIGHT UPPER INNER QUADRANT; STEREOTACTIC CORE NEEDLE BIOPSY: ?- FOCAL CHANGES COMPATIBLE WITH RADIAL SCAR. ?- BACKGROUND MODERATE STROMAL FIBROSIS AND FIBROCYSTIC CHANGES. ?- MICROCALCIFICATIONS ASSOCIATED WITH BENIGN MAMMARY ELEMENTS. ?- NEGATIVE FOR  ATYPICAL PROLIFERATIVE BREAST DISEASE. ? ?GROSS DESCRIPTION: ?A. Labeled: Right breast stereo biopsy distortion upper inner quadrant ?Received: in a formalin-filled [place] collection device ?Specimen radiograph image(s) available for review ?Time/Date in fixative: Collected at 10:53 AM on 08/08/2021 and placed in ?formalin at 10:56 AM on 08/08/2021 ?Cold ischemic time: Less than 5 minutes ?Total fixation time: Approximately 9.2 5 hours ?Core pieces: Multiple ?Measurement: Aggregate, 2.4 x 1.5 x 0.4 cm ?Description / comments: Received are cores and fragments of yellow-white ?fibrofatty tissue.  A diagram is not received on the container or with ?the requisition. ?Inked: Green ?Entirely submitted in cassette(s): ? ?1 - sections A and B ?2 - sections C and D ?3 - sections  E and F ? ?RB 08/08/2021 ? ?Final Diagnosis performed by Allena Napoleon, MD.   Electronically signed ?08/09/2021 9:27:42AM ?The electronic signature indicates that the named Attending Pathologist ?has evaluated the specimen ?Technical component performed at [facility], [address] [place], ?[place] [address] Lab: [phone] Dir: Rush Farmer, MD, MMM ? Professional component performed at [facility], [facility], [place], [place], [place] [address] Lab: [phone] ?Dir: Kathi Simpers, MD ?  ?Pregnancy, urine POC     Status: None  ? Collection Time: 09/04/21  6:18 AM  ?Result Value Ref Range  ? Preg Test, Ur NEGATIVE NEGATIVE  ?  Comment:        ?THE SENSITIVITY OF THIS ?METHODOLOGY IS >24 mIU/mL ?  ?Surgical pathology     Status: None  ? Collection Time: 09/04/21  8:18 AM  ?Result Value Ref Range  ? SURGICAL PATHOLOGY    ?  SURGICAL PATHOLOGY ?CASE: [id number] ?PATIENT: Janna Boomhower ?Surgical Pathology Report ? ? ? ? ?Specimen Submitted: ?A. Breast, right, radial scar ?B. Margin, new posterior inferior ? ?Clinical History: Right breast radial scar ? ? ? ?DIAGNOSIS: ?A. BREAST, RIGHT; EXCISION: ?- BENIGN MAMMARY PARENCHYMA WITH  DENSE STROMAL FIBROSIS AND ?PSEUDOANGIOMATOUS STROMAL HYPERPLASIA ([place]), AND FOCAL CHANGES ?COMPATIBLE WITH BENIGN ADENOSIS. ?- CHANGES COMPATIBLE WITH PRIOR BIOPSY SITE, AT POSTERIOR EDGE OF ?SPECIMEN. ?- NEGAT

## 2021-11-02 LAB — MICROSCOPIC EXAMINATION
Bacteria, UA: NONE SEEN
Casts: NONE SEEN /lpf
Epithelial Cells (non renal): NONE SEEN /hpf (ref 0–10)
WBC, UA: NONE SEEN /hpf (ref 0–5)

## 2021-11-02 LAB — UA/M W/RFLX CULTURE, ROUTINE
Bilirubin, UA: NEGATIVE
Glucose, UA: NEGATIVE
Leukocytes,UA: NEGATIVE
Nitrite, UA: NEGATIVE
Protein,UA: NEGATIVE
RBC, UA: NEGATIVE
Specific Gravity, UA: 1.016 (ref 1.005–1.030)
Urobilinogen, Ur: 0.2 mg/dL (ref 0.2–1.0)
pH, UA: 7.5 (ref 5.0–7.5)

## 2021-11-05 ENCOUNTER — Telehealth: Payer: Self-pay

## 2021-11-05 ENCOUNTER — Other Ambulatory Visit: Payer: Self-pay | Admitting: Physician Assistant

## 2021-11-05 DIAGNOSIS — Z1211 Encounter for screening for malignant neoplasm of colon: Secondary | ICD-10-CM

## 2021-11-05 MED ORDER — ROSUVASTATIN CALCIUM 5 MG PO TABS: 5.0000 mg | ORAL_TABLET | Freq: Every evening | ORAL | 1 refills | Status: DC

## 2021-11-05 MED ORDER — ERGOCALCIFEROL 1.25 MG (50000 UT) PO CAPS: 50000.0000 [IU] | ORAL_CAPSULE | ORAL | 1 refills | Status: DC

## 2021-11-05 NOTE — Telephone Encounter (Signed)
Spoke to patient and informed her of lab results and advised pt of her starting on Drisdol once a week and crestor daily.  Pt was ok with starting medications.  Pt was ok with doing OTC fish oil and B12 medications.  Pt was also asked about Her Colonoscopy and if she needed [place] to place another referral and pt did need it done.  I informed pt that once it is placed someone will contact her with the information.  Prescriptions sent to pharmacy. ?

## 2021-11-05 NOTE — Telephone Encounter (Signed)
-----   Message from Mylinda Latina, PA-C sent at 11/02/2021  2:41 PM EDT ----- ?Please let her know that her vitamin D is very low and send drisdol weekly. Additionally her cholesterol is elevated  and has increased from last year. At this point I would recommend starting on crestor '5mg'$  nightly to help improve this in addition to diet/exercise and fish oil. Her B12 is low-normal and she may want to supplement this as well especially if any numbness or tingling ?

## 2021-11-06 ENCOUNTER — Telehealth: Payer: Self-pay

## 2021-11-06 ENCOUNTER — Other Ambulatory Visit: Payer: Self-pay

## 2021-11-06 DIAGNOSIS — Z1211 Encounter for screening for malignant neoplasm of colon: Secondary | ICD-10-CM

## 2021-11-06 MED ORDER — NA SULFATE-K SULFATE-MG SULF 17.5-3.13-1.6 GM/177ML PO SOLN: 1.0000 | Freq: Once | ORAL | 0 refills | Status: AC

## 2021-11-06 NOTE — Telephone Encounter (Signed)
Gastroenterology Pre-Procedure Review ? ?Request Date: Thursday 11/22/21 ?Requesting Physician: Dr. Vicente Males ? ?PATIENT REVIEW QUESTIONS: The patient responded to the following health history questions as indicated:   ? ?1. Are you having any GI issues? yes (painful bowel movements getting worse) ?2. Do you have a personal history of Polyps? no ?3. Do you have a family history of Colon Cancer or Polyps? no ?4. Diabetes Mellitus? no ?5. Joint replacements in the past 12 months?no ?6. Major health problems in the past 3 months? Lumpectomy 09/04/21 ?7. Any artificial heart valves, MVP, or defibrillator?no ?   ?MEDICATIONS & ALLERGIES:    ?Patient reports the following regarding taking any anticoagulation/antiplatelet therapy:   ?Plavix, Coumadin, Eliquis, Xarelto, Lovenox, Pradaxa, Brilinta, or Effient? no ?Aspirin? no ? ?Patient confirms/reports the following medications:  ?Current Outpatient Medications  ?Medication Sig Dispense Refill  ? acetaminophen (TYLENOL) 500 MG tablet Take 2 tablets (1,000 mg total) by mouth every 6 (six) hours as needed for mild pain.    ? butalbital-aspirin-caffeine (FIORINAL) 50-325-40 MG capsule Take 1 capsule by mouth every 4 (four) hours as needed for headache. 45 capsule 0  ? ergocalciferol (DRISDOL) 1.25 MG (50000 UT) capsule Take 1 capsule (50,000 Units total) by mouth once a week. 12 capsule 1  ? ibuprofen (ADVIL) 600 MG tablet Take 1 tablet (600 mg total) by mouth every 8 (eight) hours as needed for moderate pain. 60 tablet 1  ? rosuvastatin (CRESTOR) 5 MG tablet Take 1 tablet (5 mg total) by mouth at bedtime. 90 tablet 1  ? ?No current facility-administered medications for this visit.  ? ? ?Patient confirms/reports the following allergies:  ?Allergies  ?Allergen Reactions  ? Codeine Other (See Comments)  ?  headache  ? ? ?No orders of the defined types were placed in this encounter. ? ? ?AUTHORIZATION INFORMATION ?Primary Insurance: ?1D#: ?Group #: ? ?Secondary Insurance: ?1D#: ?Group  #: ? ?SCHEDULE INFORMATION: ?Date: 11/22/21 ?Time: ?Location: [facility] ?

## 2021-11-20 MED ORDER — POTASSIUM CHLORIDE CRYS ER 20 MEQ PO TBCR
EXTENDED_RELEASE_TABLET | ORAL | Status: DC
Start: 2021-11-20 — End: 2021-11-20
  Filled 2021-11-20: qty 1

## 2021-11-22 ENCOUNTER — Ambulatory Visit: Payer: 59 | Admitting: Registered Nurse

## 2021-11-22 ENCOUNTER — Ambulatory Visit
Admission: RE | Admit: 2021-11-22 | Discharge: 2021-11-22 | Disposition: A | Discharge status: Home / Self Care | Payer: 59 | Attending: Gastroenterology | Admitting: Gastroenterology

## 2021-11-22 ENCOUNTER — Encounter
Admission: RE | Disposition: A | Discharge status: Home / Self Care | Payer: Self-pay | Source: Home / Self Care | Attending: Gastroenterology

## 2021-11-22 ENCOUNTER — Encounter: Payer: Self-pay | Admitting: Gastroenterology

## 2021-11-22 DIAGNOSIS — Z1211 Encounter for screening for malignant neoplasm of colon: Secondary | ICD-10-CM | POA: Diagnosis not present

## 2021-11-22 DIAGNOSIS — K640 First degree hemorrhoids: Secondary | ICD-10-CM | POA: Insufficient documentation

## 2021-11-22 DIAGNOSIS — K649 Unspecified hemorrhoids: Secondary | ICD-10-CM | POA: Diagnosis not present

## 2021-11-22 HISTORY — PX: COLONOSCOPY WITH PROPOFOL: SHX5780

## 2021-11-22 LAB — POCT PREGNANCY, URINE: Preg Test, Ur: NEGATIVE

## 2021-11-22 SURGERY — COLONOSCOPY WITH PROPOFOL
Anesthesia: General

## 2021-11-22 MED ORDER — PROPOFOL 500 MG/50ML IV EMUL
INTRAVENOUS | Status: AC
  Filled 2021-11-22: qty 50

## 2021-11-22 MED ORDER — PROPOFOL 10 MG/ML IV BOLUS
INTRAVENOUS | Status: DC | PRN
  Administered 2021-11-22: 80 mg via INTRAVENOUS

## 2021-11-22 MED ORDER — SODIUM CHLORIDE 0.9 % IV SOLN
INTRAVENOUS | Status: DC
  Administered 2021-11-22: 1000 mL via INTRAVENOUS

## 2021-11-22 MED ORDER — PROPOFOL 500 MG/50ML IV EMUL
INTRAVENOUS | Status: DC | PRN
  Administered 2021-11-22: 150 ug/kg/min via INTRAVENOUS

## 2021-11-22 MED ORDER — LIDOCAINE HCL (CARDIAC) PF 100 MG/5ML IV SOSY
PREFILLED_SYRINGE | INTRAVENOUS | Status: DC | PRN
  Administered 2021-11-22: 40 mg via INTRAVENOUS

## 2021-11-22 NOTE — Transfer of Care (Signed)
Immediate Anesthesia Transfer of Care Note  Patient: Denise Logan  Procedure(s) Performed: Procedure(s): COLONOSCOPY WITH PROPOFOL (N/A)  Patient Location: PACU and Endoscopy Unit  Anesthesia Type:General  Level of Consciousness: sedated  Airway & Oxygen Therapy: Patient Spontanous Breathing and Patient connected to nasal cannula oxygen  Post-op Assessment: Report given to RN and Post -op Vital signs reviewed and stable  Post vital signs: Reviewed and stable  Last Vitals:  Vitals:   11/22/21 1007 11/22/21 1123  BP: 110/81 (!) 81/67  Pulse: 94 76  Resp: 16 15  Temp: 36.6 C (!) 36.3 C  SpO2: 474% 259%    Complications: No apparent anesthesia complications

## 2021-11-22 NOTE — Op Note (Signed)
[facility] Gastroenterology Patient Name: Denise Logan Procedure Date: 11/22/2021 11:00 AM MRN: [id number] Account #: [id number] Date of Birth: Mar 02, 1971 Admit Type: Outpatient Age: 51 Room: [facility] ENDO ROOM 3 Gender: Female Note Status: Finalized Instrument Name: Jasper Riling [id number] Procedure:             Colonoscopy Indications:           Screening for colorectal malignant neoplasm Providers:             Jonathon Bellows MD, MD Referring MD:          No Local Md, MD (Referring MD) Medicines:             Monitored Anesthesia Care Complications:         No immediate complications. Procedure:             Pre-Anesthesia Assessment:                        - Prior to the procedure, a History and Physical was                         performed, and patient medications, allergies and                         sensitivities were reviewed. The patient's tolerance                         of previous anesthesia was reviewed.                        - The risks and benefits of the procedure and the                         sedation options and risks were discussed with the                         patient. All questions were answered and informed                         consent was obtained.                        - ASA Grade Assessment: II - A patient with mild                         systemic disease.                        After obtaining informed consent, the colonoscope was                         passed under direct vision. Throughout the procedure,                         the patient's blood pressure, pulse, and oxygen                         saturations were monitored continuously. The                         Colonoscope was  introduced through the anus and                         advanced to the the cecum, identified by the                         appendiceal orifice. The colonoscopy was performed                         with ease. The patient tolerated the procedure well.                          The quality of the bowel preparation was good. Findings:      The perianal and digital rectal examinations were normal.      Non-bleeding internal hemorrhoids were found during retroflexion. The       hemorrhoids were large and Grade I (internal hemorrhoids that do not       prolapse).      The exam was otherwise without abnormality on direct and retroflexion       views. Impression:            - Non-bleeding internal hemorrhoids.                        - The examination was otherwise normal on direct and                         retroflexion views.                        - No specimens collected. Recommendation:        - Discharge patient to home (with escort).                        - Resume previous diet.                        - Continue present medications.                        - Repeat colonoscopy in 10 years for screening                         purposes.                        - Return to GI office in 2 weeks.                        - for hemorroidal banding Procedure Code(s):     --- Professional ---                        [phone], Colonoscopy, flexible; diagnostic, including                         collection of specimen(s) by brushing or washing, when                         performed (separate procedure) Diagnosis Code(s):     --- Professional ---  Z12.11, Encounter for screening for malignant neoplasm                         of colon                        K64.0, First degree hemorrhoids CPT copyright 2019 American Medical Association. All rights reserved. The codes documented in this report are preliminary and upon coder review may  be revised to meet current compliance requirements. Jonathon Bellows, MD Jonathon Bellows MD, MD 11/22/2021 11:25:46 AM This report has been signed electronically. Number of Addenda: 0 Note Initiated On: 11/22/2021 11:00 AM Scope Withdrawal Time: 0 hours 7 minutes 9 seconds  Total Procedure Duration: 0 hours 9  minutes 20 seconds  Estimated Blood Loss:  Estimated blood loss: none.      [facility]

## 2021-11-22 NOTE — H&P (Signed)
Jonathon Bellows, MD [address] [place], [place], [place], [address] [address], [place], [place], [place], [address] Phone: [phone]  Fax: [phone]  Primary Care Physician:  Mylinda Latina, PA-C   Pre-Procedure History & Physical: HPI:  Josselyn Harkins is a 51 y.o. female is here for an colonoscopy.   Past Medical History:  Diagnosis Date   Cyst of buttocks 2014   Heart murmur    Migraines     Past Surgical History:  Procedure Laterality Date   BREAST BIOPSY Right 08/08/2021   affirm bx, ribbon marker,COMPATIBLE WITH RADIAL SCAR   BREAST LUMPECTOMY WITH RADIO FREQUENCY LOCALIZER Right 09/04/2021   Procedure: BREAST LUMPECTOMY WITH RADIO FREQUENCY LOCALIZER;  Surgeon: Olean Ree, MD;  Location: [facility] ORS;  Service: General;  Laterality: Right;   EYE SURGERY Bilateral    lasik    Prior to Admission medications   Medication Sig Start Date End Date Taking? Authorizing Provider  ergocalciferol (DRISDOL) 1.25 MG (50000 UT) capsule Take 1 capsule (50,000 Units total) by mouth once a week. 11/05/21  Yes McDonough, Lauren K, PA-C  rosuvastatin (CRESTOR) 5 MG tablet Take 1 tablet (5 mg total) by mouth at bedtime. 11/05/21  Yes McDonough, Lauren K, PA-C  acetaminophen (TYLENOL) 500 MG tablet Take 2 tablets (1,000 mg total) by mouth every 6 (six) hours as needed for mild pain. 09/04/21   Olean Ree, MD  butalbital-aspirin-caffeine [facility]) [phone] MG capsule Take 1 capsule by mouth every 4 (four) hours as needed for headache. 10/26/20   McDonough, Si Gaul, PA-C  ibuprofen (ADVIL) 600 MG tablet Take 1 tablet (600 mg total) by mouth every 8 (eight) hours as needed for moderate pain. 09/04/21   Olean Ree, MD    Allergies as of 11/06/2021 - Review Complete 11/06/2021  Allergen Reaction Noted   Codeine Other (See Comments) 07/20/2013    Family History  Problem Relation Age of Onset   Breast cancer Mother 69   Hypertension Mother    Hyperlipidemia Mother     Hypertension Father    Thyroid disease Sister     Social History   Socioeconomic History   Marital status: Married    Spouse name: Not on file   Number of children: Not on file   Years of education: Not on file   Highest education level: Not on file  Occupational History   Not on file  Tobacco Use   Smoking status: Never   Smokeless tobacco: Never  Vaping Use   Vaping Use: Never used  Substance and Sexual Activity   Alcohol use: Yes    Comment: occasionally   Drug use: No   Sexual activity: Not on file  Other Topics Concern   Not on file  Social History Narrative   Live at home with husband and parents .   Social Determinants of Health   Financial Resource Strain: Not on file  Food Insecurity: Not on file  Transportation Needs: Not on file  Physical Activity: Not on file  Stress: Not on file  Social Connections: Not on file  Intimate Partner Violence: Not on file    Review of Systems: See HPI, otherwise negative ROS  Physical Exam: BP 110/81   Pulse 94   Temp 97.9 F (36.6 C) (Temporal)   Resp 16   Ht '5\' 4"'$  (1.626 m)   Wt 59.1 kg   LMP 11/12/2021   SpO2 100%   BMI 22.36 kg/m  General:   Alert,  pleasant  and cooperative in NAD Head:  Normocephalic and atraumatic. Neck:  Supple; no masses or thyromegaly. Lungs:  Clear throughout to auscultation, normal respiratory effort.    Heart:  +S1, +S2, Regular rate and rhythm, No edema. Abdomen:  Soft, nontender and nondistended. Normal bowel sounds, without guarding, and without rebound.   Neurologic:  Alert and  oriented x4;  grossly normal neurologically.  Impression/Plan: Evyn Kooyman is here for an colonoscopy to be performed for Screening colonoscopy average risk   Risks, benefits, limitations, and alternatives regarding  colonoscopy have been reviewed with the patient.  Questions have been answered.  All parties agreeable.   Jonathon Bellows, MD  11/22/2021, 10:29 AM

## 2021-11-22 NOTE — Anesthesia Procedure Notes (Signed)
Date/Time: 11/22/2021 11:03 AM Performed by: Doreen Salvage, CRNA Pre-anesthesia Checklist: Patient identified, Emergency Drugs available, Suction available and Patient being monitored Patient Re-evaluated:Patient Re-evaluated prior to induction Oxygen Delivery Method: Nasal cannula Induction Type: IV induction Dental Injury: Teeth and Oropharynx as per pre-operative assessment  Comments: Nasal cannula with etCO2 monitoring

## 2021-11-22 NOTE — Anesthesia Preprocedure Evaluation (Signed)
Anesthesia Evaluation  Patient identified by MRN, date of birth, ID band Patient awake    Reviewed: Allergy & Precautions, NPO status , Patient's Chart, lab work & pertinent test results  Airway Mallampati: II  TM Distance: >3 FB Neck ROM: Full    Dental  (+) Teeth Intact   Pulmonary neg pulmonary ROS,    Pulmonary exam normal        Cardiovascular Exercise Tolerance: Good negative cardio ROS Normal cardiovascular exam Rhythm:Regular     Neuro/Psych negative neurological ROS  negative psych ROS   GI/Hepatic negative GI ROS, Neg liver ROS,   Endo/Other  negative endocrine ROS  Renal/GU negative Renal ROS  negative genitourinary   Musculoskeletal   Abdominal   Peds negative pediatric ROS (+)  Hematology negative hematology ROS (+)   Anesthesia Other Findings Past Medical History: 2014: Cyst of buttocks No date: Heart murmur No date: Migraines  Past Surgical History: 08/08/2021: BREAST BIOPSY; Right     Comment:  affirm bx, ribbon marker,COMPATIBLE WITH RADIAL SCAR 09/04/2021: BREAST LUMPECTOMY WITH RADIO FREQUENCY LOCALIZER; Right     Comment:  Procedure: BREAST LUMPECTOMY WITH RADIO FREQUENCY               LOCALIZER;  Surgeon: Olean Ree, MD;  Location: [facility]               ORS;  Service: General;  Laterality: Right; No date: EYE SURGERY; Bilateral     Comment:  lasik  BMI    Body Mass Index: 22.36 kg/m      Reproductive/Obstetrics negative OB ROS                             Anesthesia Physical Anesthesia Plan  ASA: 2  Anesthesia Plan: General   Post-op Pain Management:    Induction: Intravenous  PONV Risk Score and Plan: Propofol infusion and TIVA  Airway Management Planned: Natural Airway  Additional Equipment:   Intra-op Plan:   Post-operative Plan:   Informed Consent: I have reviewed the patients History and Physical, chart, labs and discussed the procedure  including the risks, benefits and alternatives for the proposed anesthesia with the patient or authorized representative who has indicated his/her understanding and acceptance.     Dental Advisory Given  Plan Discussed with: CRNA  Anesthesia Plan Comments:         Anesthesia Quick Evaluation

## 2021-11-23 ENCOUNTER — Encounter: Payer: Self-pay | Admitting: Gastroenterology

## 2021-11-23 NOTE — Anesthesia Postprocedure Evaluation (Signed)
Anesthesia Post Note  Patient: Tawana Pasch  Procedure(s) Performed: COLONOSCOPY WITH PROPOFOL  Patient location during evaluation: PACU Anesthesia Type: General Level of consciousness: awake and oriented Pain management: pain level controlled Vital Signs Assessment: post-procedure vital signs reviewed and stable Respiratory status: nonlabored ventilation and respiratory function stable Cardiovascular status: stable Anesthetic complications: no   No notable events documented.   Last Vitals:  Vitals:   11/22/21 1123 11/22/21 1143  BP: (!) 81/67 107/69  Pulse: 76   Resp: 15   Temp: (!) 36.3 C   SpO2: 100%     Last Pain:  Vitals:   11/22/21 1143  TempSrc:   PainSc: 0-No pain                 VAN STAVEREN,Yonah Tangeman

## 2022-01-10 ENCOUNTER — Encounter: Payer: Self-pay | Admitting: Gastroenterology

## 2022-01-10 ENCOUNTER — Ambulatory Visit (INDEPENDENT_AMBULATORY_CARE_PROVIDER_SITE_OTHER): Payer: 59 | Admitting: Gastroenterology

## 2022-01-10 VITALS — BP 102/67 | HR 82 | Temp 97.8°F | Ht 64.0 in | Wt 126.2 lb

## 2022-01-10 DIAGNOSIS — K648 Other hemorrhoids: Secondary | ICD-10-CM

## 2022-01-10 NOTE — Progress Notes (Signed)
Jonathon Bellows MD, MRCP(U.K) [address]  [place]  [place], [place] [address]  Main: [phone]  Fax: [phone]   Gastroenterology Consultation  Referring Provider:     Mylinda Latina, PA* Primary Care Physician:  Mylinda Latina, PA-C Primary Gastroenterologist:  Dr. Jonathon Bellows  Reason for Consultation:     Hemorrhoidal banding         HPI:   Denise Logan is a 51 y.o. y/o female  recently underwent a screening colonoscopy and was found to have large internal hemorroids and is here today to discuss about banding .   Complains of perianal itching discomfort prolapsing hemorrhoid wants to be treated   PROCEDURE NOTE: The patient presents with symptomatic grade 2 hemorrhoids, unresponsive to maximal medical therapy, requesting rubber band ligation of his/her hemorrhoidal disease.  All risks, benefits and alternative forms of therapy were described and informed consent was obtained.  In the Left Lateral Decubitus position (if anoscopy is performed) anoscopic examination revealed grade 1 hemorrhoids in the all position(s).   The decision was made to band the RA internal hemorrhoid, and the Bassett was used to perform band ligation without complication.  Digital anorectal examination was then performed to assure proper positioning of the band, and to adjust the banded tissue as required.  The patient was discharged home without pain or other issues.  Dietary and behavioral recommendations were given and (if necessary - prescriptions were given), along with follow-up instructions.  The patient will return 4 weeks for follow-up and possible additional banding as required.  No complications were encountered and the patient tolerated the procedure well.    Past Medical History:  Diagnosis Date   Cyst of buttocks 2014   Heart murmur    Migraines     Past Surgical History:  Procedure Laterality Date   BREAST BIOPSY Right 08/08/2021   affirm bx, ribbon  marker,COMPATIBLE WITH RADIAL SCAR   BREAST LUMPECTOMY WITH RADIO FREQUENCY LOCALIZER Right 09/04/2021   Procedure: BREAST LUMPECTOMY WITH RADIO FREQUENCY LOCALIZER;  Surgeon: Olean Ree, MD;  Location: [facility] ORS;  Service: General;  Laterality: Right;   COLONOSCOPY WITH PROPOFOL N/A 11/22/2021   Procedure: COLONOSCOPY WITH PROPOFOL;  Surgeon: Jonathon Bellows, MD;  Location: [facility] ENDOSCOPY;  Service: Gastroenterology;  Laterality: N/A;   EYE SURGERY Bilateral    lasik    Prior to Admission medications   Medication Sig Start Date End Date Taking? Authorizing Provider  acetaminophen (TYLENOL) 500 MG tablet Take 2 tablets (1,000 mg total) by mouth every 6 (six) hours as needed for mild pain. 09/04/21   Olean Ree, MD  butalbital-aspirin-caffeine [facility]) [phone] MG capsule Take 1 capsule by mouth every 4 (four) hours as needed for headache. 10/26/20   McDonough, Si Gaul, PA-C  ergocalciferol (DRISDOL) 1.25 MG (50000 UT) capsule Take 1 capsule (50,000 Units total) by mouth once a week. 11/05/21   McDonough, Si Gaul, PA-C  ibuprofen (ADVIL) 600 MG tablet Take 1 tablet (600 mg total) by mouth every 8 (eight) hours as needed for moderate pain. 09/04/21   Olean Ree, MD  rosuvastatin (CRESTOR) 5 MG tablet Take 1 tablet (5 mg total) by mouth at bedtime. 11/05/21   McDonough, Si Gaul, PA-C    Family History  Problem Relation Age of Onset   Breast cancer Mother 13   Hypertension Mother    Hyperlipidemia Mother    Hypertension Father    Thyroid disease Sister      Social History  Tobacco Use   Smoking status: Never   Smokeless tobacco: Never  Vaping Use   Vaping Use: Never used  Substance Use Topics   Alcohol use: Yes    Comment: occasionally   Drug use: No    Allergies as of 01/10/2022 - Review Complete 11/22/2021  Allergen Reaction Noted   Codeine Other (See Comments) 07/20/2013    Review of Systems:    All systems reviewed and negative except where noted in HPI.   Physical  Exam:  There were no vitals taken for this visit. No LMP recorded. (Menstrual status: Irregular Periods). Psych:  Alert and cooperative. Normal mood and affect. General:   Alert,  Well-developed, well-nourished, pleasant and cooperative in NAD Head:  Normocephalic and atraumatic. Eyes:  Sclera clear, no icterus.   Conjunctiva pink. Neurologic:  Alert and oriented x3;  grossly normal neurologically. Psych:  Alert and cooperative. Normal mood and affect.  Imaging Studies: No results found.  Assessment and Plan:   Denise Logan is a 51 y.o. y/o female has been referred for hemorrhoidal banding .  Status post treatment of the right anterior column  Follow up in 4 weeks  Dr Jonathon Bellows MD,MRCP(U.K)

## 2022-01-28 ENCOUNTER — Other Ambulatory Visit: Payer: Self-pay | Admitting: Physician Assistant

## 2022-01-28 DIAGNOSIS — R519 Headache, unspecified: Secondary | ICD-10-CM

## 2022-02-26 ENCOUNTER — Ambulatory Visit (INDEPENDENT_AMBULATORY_CARE_PROVIDER_SITE_OTHER): Payer: 59 | Admitting: Gastroenterology

## 2022-02-26 ENCOUNTER — Ambulatory Visit (INDEPENDENT_AMBULATORY_CARE_PROVIDER_SITE_OTHER): Payer: 59 | Admitting: Physician Assistant

## 2022-02-26 ENCOUNTER — Encounter: Payer: Self-pay | Admitting: Gastroenterology

## 2022-02-26 ENCOUNTER — Encounter: Payer: Self-pay | Admitting: Physician Assistant

## 2022-02-26 ENCOUNTER — Other Ambulatory Visit: Payer: Self-pay

## 2022-02-26 VITALS — BP 109/74 | HR 74 | Temp 99.3°F | Ht 64.0 in | Wt 129.0 lb

## 2022-02-26 VITALS — BP 110/75 | HR 78 | Temp 98.2°F | Ht 64.0 in | Wt 128.6 lb

## 2022-02-26 DIAGNOSIS — K643 Fourth degree hemorrhoids: Secondary | ICD-10-CM

## 2022-02-26 DIAGNOSIS — K648 Other hemorrhoids: Secondary | ICD-10-CM

## 2022-02-26 DIAGNOSIS — K642 Third degree hemorrhoids: Secondary | ICD-10-CM

## 2022-02-26 MED ORDER — LIDOCAINE (ANORECTAL) 5 % EX CREA: 1.0000 | TOPICAL_CREAM | Freq: Two times a day (BID) | CUTANEOUS | 0 refills | Status: DC

## 2022-02-26 MED ORDER — HYDROCORTISONE 2.5 % EX CREA: TOPICAL_CREAM | Freq: Two times a day (BID) | CUTANEOUS | 0 refills | Status: DC

## 2022-02-26 NOTE — Progress Notes (Signed)
Patient follow-ups today for banding of hemorrhoids    Summary of history : Denise Logan is a 51 y.o. y/o female  recently underwent a screening colonoscopy and was found to have large internal hemorroids and is here today to discuss about banding .    First round:01/10/2022: RA column banded   Interval history 01/10/2022-02/26/2022  She did well after the banding but states subsequently the pain and discomfort with anal seepage recurred and presently is having pain after bowel movement.  Digital rectal exam performed in the presence of a chaperone. External anal findings: Prolapsed third-degree hemorrhoid with inflammation and possible thrombosis Internal findings: Pain on palpation of the external hemorrhoid   She is here today to follow-up for hemorrhoidal banding that was performed more than 6 weeks back.  She had initial improvement of her symptoms but recently had pain in the perianal area and discomfort after bowel movement and now has tenderness on palpation in the perianal area   Plan:  Urgent referral to surgery to evaluate thrombosed prolapsed hemorrhoid?  Requires incision and drainage versus surgery.  Our office will contact the surgeon to see she can be seen as soon as possible.  Recommend to use sitz bath.  At this point of time cannot perform hemorrhoidal banding.   Follow-up: As needed  Dr Jonathon Bellows MD,MRCP [facility]) Gastroenterology/Hepatology Pager: [phone]

## 2022-02-26 NOTE — Progress Notes (Unsigned)
[facility] SUR[facility]IC NOTE  02/27/2022  History of Present Illness: Denise Logan is a 51 y.o. female presenting with concerned over possible thrombosed hemorrhoid. Known to our office following right breast lumpectomy on 09/04/21 with dr Hampton Abbot for what proved to be [place]. She did well. Please note, she is following up today for an unrelated problem. She has a history of hemorrhoids in the past and has been following up with Dr Vicente Males for this. She is currently in the process of having the areas banded. She has undergone this once on 07/13. She was scheduled to have this done again today but around lunchtime she had a BM and noticed significant increase in pain in the area. Typically, these will resolved/reduce on their own but the pain persisted. She denied any fever, chills, blood in stool, nausea, emesis. She presented to Dr Georgeann Oppenheim office but there was concern for possible thrombosis, so she was referred here urgently. She has not tried any topical treatments either. No other complaints or concerns today.   Past Medical History: Past Medical History:  Diagnosis Date   Cyst of buttocks 2014   Heart murmur    Migraines      Past Surgical History: Past Surgical History:  Procedure Laterality Date   BREAST BIOPSY Right 08/08/2021   affirm bx, ribbon marker,COMPATIBLE WITH RADIAL SCAR   BREAST LUMPECTOMY WITH RADIO FREQUENCY LOCALIZER Right 09/04/2021   Procedure: BREAST LUMPECTOMY WITH RADIO FREQUENCY LOCALIZER;  Surgeon: Olean Ree, MD;  Location: [facility] ORS;  Service: General;  Laterality: Right;   COLONOSCOPY WITH PROPOFOL N/A 11/22/2021   Procedure: COLONOSCOPY WITH PROPOFOL;  Surgeon: Jonathon Bellows, MD;  Location: [facility] ENDOSCOPY;  Service: Gastroenterology;  Laterality: N/A;   EYE SURGERY Bilateral    lasik    Home Medications: Prior to Admission medications   Medication Sig Start Date End Date Taking? Authorizing Provider  butalbital-aspirin-caffeine [facility])  [phone] MG capsule TAKE 1 CAPSULE BY MOUTH EVERY 4 (FOUR) HOURS AS NEEDED FOR HEADACHE. 01/29/22  Yes Lavera Guise, MD  ergocalciferol (DRISDOL) 1.25 MG (50000 UT) capsule Take 1 capsule (50,000 Units total) by mouth once a week. 11/05/21  Yes McDonough, Si Gaul, PA-C    Allergies: Allergies  Allergen Reactions   Codeine Other (See Comments)    headache    Review of Systems: Review of Systems  Constitutional:  Negative for chills and fever.  Respiratory:  Negative for cough and shortness of breath.   Cardiovascular:  Negative for chest pain and palpitations.  Gastrointestinal:  Negative for blood in stool, diarrhea, nausea and vomiting.       + Hemorrhoids  Genitourinary:  Negative for dysuria and urgency.  All other systems reviewed and are negative.   Physical Exam BP 110/75   Pulse 78   Temp 98.2 F (36.8 C) (Oral)   Ht '5\' 4"'$  (1.626 m)   Wt 128 lb 9.6 oz (58.3 kg)   SpO2 100%   BMI 22.07 kg/m   Physical Exam Vitals and nursing note reviewed. Exam conducted with a chaperone present.  Constitutional:      General: She is not in acute distress.    Appearance: Normal appearance. She is normal weight. She is not ill-appearing.  HENT:     Head: Normocephalic and atraumatic.  Eyes:     General: No scleral icterus.    Conjunctiva/sclera: Conjunctivae normal.  Pulmonary:     Effort: Pulmonary effort is normal. No respiratory distress.     Breath sounds: Normal breath  sounds.  Genitourinary:    Rectum: Tenderness and external hemorrhoid present.     Comments: Chaperone present. On examination she has prolapsed external hemorrhoid, right sided, there is slight discoloration inferiorly but I do not appreciate any gross evidence of thrombosis, hemorrhoids are very soft, of course tender to palpation, easily reduce. Internal examination did not reveal any internal hemorrhoids  Musculoskeletal:     Right lower leg: No edema.     Left lower leg: No edema.  Skin:    General:  Skin is warm and dry.     Coloration: Skin is not jaundiced.  Neurological:     General: No focal deficit present.     Mental Status: She is alert and oriented to person, place, and time.  Psychiatric:        Mood and Affect: Mood normal.        Behavior: Behavior normal.     Labs/Imaging: No new pertinent imaging studies or laboratory results    Assessment and Plan: This is a 51 y.o. female prolapse external hemorrhoid, no evidence of thrombosis   - No definitive evidence thrombosis on examination; I do not think she warrants evacuation at this time.   - Will prescribe anorectal lidocaine cream for analgesia; can use OTC remedies as well  - Continue SITZ baths  - I will have her RTC in ~1 week to review if there is a role for possible hemorrhoidectomy with Dr Hampton Abbot vs continuation of banding with Dr Vicente Males.   - She and her husband understand to call in the interim with questions/concerns   Face-to-face time spent with the patient and care providers was 20 minutes, with more than 50% of the time spent counseling, educating, and coordinating care of the patient.     Edison Simon, PA-C [facility] 02/27/2022, 9:48 AM M-F: 7am - 4pm

## 2022-02-26 NOTE — Patient Instructions (Addendum)
Pick up your medicine at the pharmacy.    How to Take a [employer] A sitz bath is a warm water bath that may be used to care for your rectum, genital area, or the area between your rectum and genitals (perineum). In a sitz bath, the water only comes up to your hips and covers your buttocks. A sitz bath may be done in a bathtub or with a portable sitz bath that fits over the toilet. Your health care provider may recommend a sitz bath to help: Relieve pain and discomfort after delivering a baby. Relieve pain and itching from hemorrhoids or anal fissures. Relieve pain after certain surgeries. Relax muscles that are sore or tight. How to take a sitz bath Take 2-4 sitz baths a day, or as many as told by your health care provider. Bathtub sitz bath To take a sitz bath in a bathtub: Partially fill a bathtub with warm water. The water should be deep enough to cover your hips and buttocks when you are sitting in the bathtub. Follow your health care provider's instructions if you are told to put medicine in the water. Sit in the water. Open the bathtub drain a little, and leave it open during your bath. Turn on the warm water again, enough to replace the water that is draining out. Keep the water running throughout your bath. This helps keep the water at the right level and temperature. Soak in the water for 15-20 minutes, or as long as told by your health care provider. When you are done, be careful when you stand up. You may feel dizzy. After the sitz bath, pat yourself dry. Do not rub your skin to dry it.  Over-the-toilet sitz bath To take a sitz bath with an over-the-toilet basin: Follow the manufacturer's instructions. Fill the basin with warm water. Follow your health care provider's instructions if you were told to put medicine in the water. Sit on the seat. Make sure the water covers your buttocks and perineum. Soak in the water for 15-20 minutes, or as long as told by your health care  provider. After the sitz bath, pat yourself dry. Do not rub your skin to dry it. Clean and dry the basin between uses. Discard the basin if it cracks, or according to the manufacturer's instructions.  Contact a health care provider if: Your pain or itching gets worse. Stop doing sitz baths if your symptoms get worse. You have new symptoms. Stop doing sitz baths until you talk with your health care provider. Summary A sitz bath is a warm water bath in which the water only comes up to your hips and covers your buttocks. Your health care provider may recommend a sitz bath to help relieve pain and discomfort after delivering a baby, relieve pain and itching from hemorrhoids or anal fissures, relieve pain after certain surgeries, or help to relax muscles that are sore or tight. Take 2-4 sitz baths a day, or as many as told by your health care provider. Soak in the water for 15-20 minutes. Stop doing sitz baths if your symptoms get worse. This information is not intended to replace advice given to you by your health care provider. Make sure you discuss any questions you have with your health care provider. Document Revised: 09/18/2021 Document Reviewed: 09/18/2021 Elsevier Patient Education  [place]

## 2022-02-27 ENCOUNTER — Encounter: Payer: Self-pay | Admitting: Physician Assistant

## 2022-02-27 ENCOUNTER — Ambulatory Visit: Payer: 59 | Admitting: Surgery

## 2022-03-11 ENCOUNTER — Ambulatory Visit: Payer: 59 | Admitting: Surgery

## 2022-03-15 ENCOUNTER — Ambulatory Visit (INDEPENDENT_AMBULATORY_CARE_PROVIDER_SITE_OTHER): Payer: 59 | Admitting: Surgery

## 2022-03-15 ENCOUNTER — Encounter: Payer: Self-pay | Admitting: Surgery

## 2022-03-15 ENCOUNTER — Other Ambulatory Visit: Payer: Self-pay

## 2022-03-15 VITALS — BP 111/77 | HR 76 | Temp 98.4°F | Ht 64.0 in | Wt 129.0 lb

## 2022-03-15 DIAGNOSIS — K648 Other hemorrhoids: Secondary | ICD-10-CM

## 2022-03-15 DIAGNOSIS — K644 Residual hemorrhoidal skin tags: Secondary | ICD-10-CM

## 2022-03-15 MED ORDER — LIDOCAINE 5 % EX OINT: 1.0000 | TOPICAL_OINTMENT | Freq: Four times a day (QID) | CUTANEOUS | 0 refills | Status: DC | PRN

## 2022-03-15 NOTE — Patient Instructions (Addendum)
Avoid constipation.  How to Take a [employer] A sitz bath is a warm water bath that may be used to care for your rectum, genital area, or the area between your rectum and genitals (perineum). In a sitz bath, the water only comes up to your hips and covers your buttocks. A sitz bath may be done in a bathtub or with a portable sitz bath that fits over the toilet. Your health care provider may recommend a sitz bath to help: Relieve pain and discomfort after delivering a baby. Relieve pain and itching from hemorrhoids or anal fissures. Relieve pain after certain surgeries. Relax muscles that are sore or tight. How to take a sitz bath Take 2-4 sitz baths a day, or as many as told by your health care provider. Bathtub sitz bath To take a sitz bath in a bathtub: Partially fill a bathtub with warm water. The water should be deep enough to cover your hips and buttocks when you are sitting in the bathtub. Follow your health care provider's instructions if you are told to put medicine in the water. Sit in the water. Open the bathtub drain a little, and leave it open during your bath. Turn on the warm water again, enough to replace the water that is draining out. Keep the water running throughout your bath. This helps keep the water at the right level and temperature. Soak in the water for 15-20 minutes, or as long as told by your health care provider. When you are done, be careful when you stand up. You may feel dizzy. After the sitz bath, pat yourself dry. Do not rub your skin to dry it.  Over-the-toilet sitz bath To take a sitz bath with an over-the-toilet basin: Follow the manufacturer's instructions. Fill the basin with warm water. Follow your health care provider's instructions if you were told to put medicine in the water. Sit on the seat. Make sure the water covers your buttocks and perineum. Soak in the water for 15-20 minutes, or as long as told by your health care provider. After the sitz  bath, pat yourself dry. Do not rub your skin to dry it. Clean and dry the basin between uses. Discard the basin if it cracks, or according to the manufacturer's instructions.  Contact a health care provider if: Your pain or itching gets worse. Stop doing sitz baths if your symptoms get worse. You have new symptoms. Stop doing sitz baths until you talk with your health care provider. Summary A sitz bath is a warm water bath in which the water only comes up to your hips and covers your buttocks. Your health care provider may recommend a sitz bath to help relieve pain and discomfort after delivering a baby, relieve pain and itching from hemorrhoids or anal fissures, relieve pain after certain surgeries, or help to relax muscles that are sore or tight. Take 2-4 sitz baths a day, or as many as told by your health care provider. Soak in the water for 15-20 minutes. Stop doing sitz baths if your symptoms get worse. This information is not intended to replace advice given to you by your health care provider. Make sure you discuss any questions you have with your health care provider. Document Revised: 09/18/2021 Document Reviewed: 09/18/2021 Elsevier Patient Education  [employer].  cream twice daily for 2 weeks.        Hemorrhoids Hemorrhoids are swollen veins that may develop: In the butt (rectum). These are called internal hemorrhoids. Around the opening  of the butt (anus). These are called external hemorrhoids. Hemorrhoids can cause pain, itching, or bleeding. Most of the time, they do not cause serious problems. They usually get better with diet changes, lifestyle changes, and other home treatments. What are the causes? This condition may be caused by: Having trouble pooping (constipation). Pushing hard (straining) to poop. Watery poop (diarrhea). Pregnancy. Being very overweight (obese). Sitting for long periods of time. Heavy lifting or other activity that causes you to  strain. Anal sex. Riding a bike for a long period of time. What are the signs or symptoms? Symptoms of this condition include: Pain. Itching or soreness in the butt. Bleeding from the butt. Leaking poop. Swelling in the area. One or more lumps around the opening of your butt. How is this diagnosed? A doctor can often diagnose this condition by looking at the affected area. The doctor may also: Do an exam that involves feeling the area with a gloved hand (digital rectal exam). Examine the area inside your butt using a small tube (anoscope). Order blood tests. This may be done if you have lost a lot of blood. Have you get a test that involves looking inside the colon using a flexible tube with a camera on the end (sigmoidoscopy or colonoscopy). How is this treated? This condition can usually be treated at home. Your doctor may tell you to change what you eat, make lifestyle changes, or try home treatments. If these do not help, procedures can be done to remove the hemorrhoids or make them smaller. These may involve: Placing rubber bands at the base of the hemorrhoids to cut off their blood supply. Injecting medicine into the hemorrhoids to shrink them. Shining a type of light energy onto the hemorrhoids to cause them to fall off. Doing surgery to remove the hemorrhoids or cut off their blood supply. Follow these instructions at home: Eating and drinking  Eat foods that have a lot of fiber in them. These include whole grains, beans, nuts, fruits, and vegetables. Ask your doctor about taking products that have added fiber (fibersupplements). Reduce the amount of fat in your diet. You can do this by: Eating low-fat dairy products. Eating less red meat. Avoiding processed foods. Drink enough fluid to keep your pee (urine) pale yellow. Managing pain and swelling  Take a warm-water bath (sitz bath) for 20 minutes to ease pain. Do this 3-4 times a day. You may do this in a bathtub or using  a portable sitz bath that fits over the toilet. If told, put ice on the painful area. It may be helpful to use ice between your warm baths. Put ice in a plastic bag. Place a towel between your skin and the bag. Leave the ice on for 20 minutes, 2-3 times a day. General instructions Take over-the-counter and prescription medicines only as told by your doctor. Medicated creams and medicines may be used as told. Exercise often. Ask your doctor how much and what kind of exercise is best for you. Go to the bathroom when you have the urge to poop. Do not wait. Avoid pushing too hard when you poop. Keep your butt dry and clean. Use wet toilet paper or moist towelettes after pooping. Do not sit on the toilet for a long time. Keep all follow-up visits as told by your doctor. This is important. Contact a doctor if you: Have pain and swelling that do not get better with treatment or medicine. Have trouble pooping. Cannot poop. Have pain or swelling  outside the area of the hemorrhoids. Get help right away if you have: Bleeding that will not stop. Summary Hemorrhoids are swollen veins in the butt or around the opening of the butt. They can cause pain, itching, or bleeding. Eat foods that have a lot of fiber in them. These include whole grains, beans, nuts, fruits, and vegetables. Take a warm-water bath (sitz bath) for 20 minutes to ease pain. Do this 3-4 times a day. This information is not intended to replace advice given to you by your health care provider. Make sure you discuss any questions you have with your health care provider. Document Revised: 12/27/2020 Document Reviewed: 12/27/2020 Elsevier Patient Education

## 2022-03-15 NOTE — Progress Notes (Signed)
03/15/2022  History of Present Illness: Denise Logan is a 51 y.o. female presenting for follow-up of inflamed external hemorrhoids.  The patient was seen on 02/26/2022 by Mr. Olean Ree for inflamed external hemorrhoid and was prescribed lidocaine and hydrocortisone ointments.  There is no thrombosis so no clot evacuation was needed.  The patient reports that her insurance would not cover the lidocaine as it was too expensive and she has not really used the hydrocortisone she did not want to take medications unless they are absolutely needed.  She reports that her discomfort has been improving although she still has tenderness particularly with bowel movements and after a while it gets better.  Denies any bleeding.  Denies any constipation or straining hard for bowel movement.  Past Medical History: Past Medical History:  Diagnosis Date   Cyst of buttocks 2014   Heart murmur    Migraines      Past Surgical History: Past Surgical History:  Procedure Laterality Date   BREAST BIOPSY Right 08/08/2021   affirm bx, ribbon marker,COMPATIBLE WITH RADIAL SCAR   BREAST LUMPECTOMY WITH RADIO FREQUENCY LOCALIZER Right 09/04/2021   Procedure: BREAST LUMPECTOMY WITH RADIO FREQUENCY LOCALIZER;  Surgeon: Olean Ree, MD;  Location: [facility] ORS;  Service: General;  Laterality: Right;   COLONOSCOPY WITH PROPOFOL N/A 11/22/2021   Procedure: COLONOSCOPY WITH PROPOFOL;  Surgeon: Jonathon Bellows, MD;  Location: [facility] ENDOSCOPY;  Service: Gastroenterology;  Laterality: N/A;   EYE SURGERY Bilateral    lasik    Home Medications: Prior to Admission medications   Medication Sig Start Date End Date Taking? Authorizing Provider  butalbital-aspirin-caffeine [facility]) 50-325-40 MG capsule TAKE 1 CAPSULE BY MOUTH EVERY 4 (FOUR) HOURS AS NEEDED FOR HEADACHE. 01/29/22  Yes Lavera Guise, MD  ergocalciferol (DRISDOL) 1.25 MG (50000 UT) capsule Take 1 capsule (50,000 Units total) by mouth once a week. 11/05/21  Yes McDonough, Lauren K,  PA-C  hydrocortisone 2.5 % cream Apply topically 2 (two) times daily. 02/26/22  Yes [provider]  lidocaine (XYLOCAINE) 5 % ointment Apply 1 Application topically 4 (four) times daily as needed for mild pain. 03/15/22  Yes Priscilla Kirstein, Jacqulyn Bath, MD  Lidocaine, Anorectal, 5 % CREA Apply 1 Application topically in the morning and at bedtime. 02/26/22  Yes Tylene Fantasia, PA-C    Allergies: Allergies  Allergen Reactions   Codeine Other (See Comments)    headache    Review of Systems: Review of Systems  Constitutional:  Negative for chills and fever.  Respiratory:  Negative for shortness of breath.   Cardiovascular:  Negative for chest pain.  Gastrointestinal:  Negative for abdominal pain, blood in stool, constipation, nausea and vomiting.    Physical Exam BP 111/77   Pulse 76   Temp 98.4 F (36.9 C) (Oral)   Ht '5\' 4"'$  (1.626 m)   Wt 129 lb (58.5 kg)   SpO2 99%   BMI 22.14 kg/m  CONSTITUTIONAL: No acute distress, well-nourished HEENT:  Normocephalic, atraumatic, extraocular motion intact. RESPIRATORY:  Normal respiratory effort without pathologic use of accessory muscles. CARDIOVASCULAR: Regular rhythm and rate. RECTAL: External exam reveals enlarged right anterior and mildly enlarged right posterior columns externally however they are not inflamed and they are soft.  However there is some tenderness to palpation posteriorly.  Left lateral external component is normal in size.  There is no anal fissures noted.  Digital rectal exam reveals some enlarged internal components as well with no gross blood and also some pain when pushing posteriorly. NEUROLOGIC:  Motor  and sensation is grossly normal.  Cranial nerves are grossly intact. PSYCH:  Alert and oriented to person, place and time. Affect is normal.   Assessment and Plan: This is a 51 y.o. female with internal and external hemorrhoids.  - Discussed with the patient the difference between internal and external components and  how the internal components of hemorrhoids can cause bleeding issues but no pain and the external components can cause mostly pain.  The external hemorrhoids can thrombose which can lead to more exquisite pain however, which may need a procedure to evaluate the clot.  It appears as she had inflamed external hemorrhoid.  There may still be some residual inflammation posteriorly which is why she's tender still.  There's no evidence of anal fissure or abscess.   -- Discussed that options for conservative measures vs surgery, but surgery can bring about a lot of post-op discomfort.  Typically would want to try conservative measures before jumping to surgery.  She has not really used her medications, so I recommended that she start using the hydrocortisone to help relieve any residual inflammation, will give a new prescription for a different size lidocaine to see if that's better covered, she should also do Sitz baths at least once daily after bowel movements, and avoid any constipation by using Miralax or stool softeners as needed. -- She will follow up with me in 3 weeks to assess her progress.  I spent 30 minutes dedicated to the care of this patient on the date of this encounter to include pre-visit review of records, face-to-face time with the patient discussing diagnosis and management, and any post-visit coordination of care.   Melvyn Neth, [facility]

## 2022-04-01 ENCOUNTER — Ambulatory Visit: Payer: 59 | Admitting: Surgery

## 2022-04-23 ENCOUNTER — Other Ambulatory Visit: Payer: Self-pay | Admitting: Physician Assistant

## 2022-04-23 NOTE — Telephone Encounter (Signed)
Pt need appt for refills  ?

## 2022-04-29 ENCOUNTER — Encounter: Payer: Self-pay | Admitting: Physician Assistant

## 2022-04-29 ENCOUNTER — Ambulatory Visit (INDEPENDENT_AMBULATORY_CARE_PROVIDER_SITE_OTHER): Payer: 59 | Admitting: Physician Assistant

## 2022-04-29 VITALS — BP 106/70 | HR 83 | Temp 98.3°F | Resp 16 | Ht 64.0 in | Wt 127.8 lb

## 2022-04-29 DIAGNOSIS — B9789 Other viral agents as the cause of diseases classified elsewhere: Secondary | ICD-10-CM

## 2022-04-29 DIAGNOSIS — J019 Acute sinusitis, unspecified: Secondary | ICD-10-CM

## 2022-04-29 NOTE — Progress Notes (Signed)
[facility] [place], [place] [address]  Internal MEDICINE  Office Visit Note  Patient Name: Denise Logan  [id number]  [id number]  Date of Service: 04/30/2022  Chief Complaint  Patient presents with   Acute Visit   Cough   Sore Throat     HPI Pt is here for a sick visit. -Started Friday and getting worse. Covid test negative on Sat and today. No one else sick -Cough is productive, yellowish phlegm -Some sore throat and body aches -tylenol cold and flu, lozenges. Not helping much. Did do salt water gargle last night -mild sinus pressure headaches -No wheezing or SOB other than needing to sit up this morning due to congestion -Will start nasal spray and mucinex DM, if not improving or any worsening in the next few days may call back for ABX  Current Medication:  Outpatient Encounter Medications as of 04/29/2022  Medication Sig   butalbital-aspirin-caffeine (FIORINAL) 50-325-40 MG capsule TAKE 1 CAPSULE BY MOUTH EVERY 4 (FOUR) HOURS AS NEEDED FOR HEADACHE.   hydrocortisone 2.5 % cream Apply topically 2 (two) times daily.   lidocaine (XYLOCAINE) 5 % ointment Apply 1 Application topically 4 (four) times daily as needed for mild pain.   Lidocaine, Anorectal, 5 % CREA Apply 1 Application topically in the morning and at bedtime.   Vitamin D, Ergocalciferol, (DRISDOL) 1.25 MG (50000 UNIT) CAPS capsule TAKE 1 CAPSULE BY MOUTH ONE TIME PER WEEK   No facility-administered encounter medications on file as of 04/29/2022.      Medical History: Past Medical History:  Diagnosis Date   Cyst of buttocks 2014   Heart murmur    Migraines      Vital Signs: BP 106/70   Pulse 83   Temp 98.3 F (36.8 C)   Resp 16   Ht '5\' 4"'$  (1.626 m)   Wt 127 lb 12.8 oz (58 kg)   SpO2 99%   BMI 21.94 kg/m    Review of Systems  Constitutional:  Negative for fatigue and fever.  HENT:  Positive for congestion, sinus pressure and sore throat. Negative for mouth sores and  postnasal drip.   Respiratory:  Positive for cough. Negative for shortness of breath and wheezing.   Cardiovascular:  Negative for chest pain.  Genitourinary:  Negative for flank pain.  Musculoskeletal:  Positive for myalgias.  Psychiatric/Behavioral: Negative.      Physical Exam Vitals and nursing note reviewed.  Constitutional:      Appearance: Normal appearance.  HENT:     Head: Normocephalic and atraumatic.     Right Ear: Tympanic membrane normal.     Left Ear: Tympanic membrane normal.     Nose: Rhinorrhea present.  Eyes:     Pupils: Pupils are equal, round, and reactive to light.  Cardiovascular:     Rate and Rhythm: Normal rate and regular rhythm.  Pulmonary:     Effort: Pulmonary effort is normal.     Breath sounds: Normal breath sounds. No wheezing.  Skin:    General: Skin is warm and dry.  Neurological:     General: No focal deficit present.     Mental Status: She is alert and oriented to person, place, and time.  Psychiatric:        Mood and Affect: Mood normal.        Behavior: Behavior normal.        Thought Content: Thought content normal.        Judgment: Judgment normal.  Assessment/Plan: 1. Acute viral sinusitis Likely viral infection at this point and will start with using nasal spray and mucinex DM to help congestion and cough. May use tylenol as needed and continue lozenges, salt water gargle, and warm tea with honey to soothe throat. Advised to drink plenty of fluids and rest and to call if not improving or worsening and may need to consider ABX then.   General Counseling: aricka goldberger understanding of the findings of todays visit and agrees with plan of treatment. I have discussed any further diagnostic evaluation that may be needed or ordered today. We also reviewed her medications today. she has been encouraged to call the office with any questions or concerns that should arise related to todays visit.    Counseling:    No orders of  the defined types were placed in this encounter.   No orders of the defined types were placed in this encounter.   Time spent:30 Minutes

## 2022-05-01 ENCOUNTER — Telehealth: Payer: Self-pay | Admitting: Physician Assistant

## 2022-05-01 ENCOUNTER — Other Ambulatory Visit: Payer: Self-pay | Admitting: Physician Assistant

## 2022-05-01 MED ORDER — AZITHROMYCIN 250 MG PO TABS: ORAL_TABLET | ORAL | 0 refills | Status: AC

## 2022-05-02 ENCOUNTER — Ambulatory Visit: Payer: 59 | Admitting: Physician Assistant

## 2022-05-02 NOTE — Telephone Encounter (Signed)
Error

## 2022-05-30 ENCOUNTER — Encounter: Payer: Self-pay | Admitting: Physician Assistant

## 2022-05-30 ENCOUNTER — Ambulatory Visit: Payer: 59 | Admitting: Physician Assistant

## 2022-05-30 VITALS — BP 94/58 | HR 79 | Temp 98.2°F | Resp 16 | Ht 64.0 in | Wt 128.2 lb

## 2022-05-30 DIAGNOSIS — K644 Residual hemorrhoidal skin tags: Secondary | ICD-10-CM

## 2022-05-30 DIAGNOSIS — I959 Hypotension, unspecified: Secondary | ICD-10-CM | POA: Diagnosis not present

## 2022-05-30 DIAGNOSIS — K648 Other hemorrhoids: Secondary | ICD-10-CM | POA: Diagnosis not present

## 2022-05-30 NOTE — Progress Notes (Signed)
[facility] [place],  [address]  Internal MEDICINE  Office Visit Note  Patient Name: Denise Logan  [id number]  [id number]  Date of Service: 05/30/2022  Chief Complaint  Patient presents with   Follow-up    HPI Pt is here for routine follow up -She has no concerns today -has recovered from sinus infection -Did have her colonoscopy and states it looked good, but did have hemorrhoids, but does not need to repeat for 10 years. Reports banding did not work and she was referred to general surgery who advised on conservative treatment vs surgery. She cannot do surgery at this time as the recovery is lengthy and she is caring for her parents. Will manage symptoms and follow up with them as needed -was blowing leaves earlier today and thinks she overdid it a little, but was taking advantage of the weather warming up a little -BP on the low side but is typical for her and is asymptomatic. Does not feel dizzy or lightheaded  Current Medication: Outpatient Encounter Medications as of 05/30/2022  Medication Sig   butalbital-aspirin-caffeine (FIORINAL) 50-325-40 MG capsule TAKE 1 CAPSULE BY MOUTH EVERY 4 (FOUR) HOURS AS NEEDED FOR HEADACHE.   hydrocortisone 2.5 % cream Apply topically 2 (two) times daily.   lidocaine (XYLOCAINE) 5 % ointment Apply 1 Application topically 4 (four) times daily as needed for mild pain.   Lidocaine, Anorectal, 5 % CREA Apply 1 Application topically in the morning and at bedtime.   Vitamin D, Ergocalciferol, (DRISDOL) 1.25 MG (50000 UNIT) CAPS capsule TAKE 1 CAPSULE BY MOUTH ONE TIME PER WEEK   No facility-administered encounter medications on file as of 05/30/2022.    Surgical History: Past Surgical History:  Procedure Laterality Date   BREAST BIOPSY Right 08/08/2021   affirm bx, ribbon marker,COMPATIBLE WITH RADIAL SCAR   BREAST LUMPECTOMY WITH RADIO FREQUENCY LOCALIZER Right 09/04/2021   Procedure: BREAST LUMPECTOMY WITH RADIO  FREQUENCY LOCALIZER;  Surgeon: Olean Ree, MD;  Location: [facility] ORS;  Service: General;  Laterality: Right;   COLONOSCOPY WITH PROPOFOL N/A 11/22/2021   Procedure: COLONOSCOPY WITH PROPOFOL;  Surgeon: Jonathon Bellows, MD;  Location: [facility] ENDOSCOPY;  Service: Gastroenterology;  Laterality: N/A;   EYE SURGERY Bilateral    lasik    Medical History: Past Medical History:  Diagnosis Date   Cyst of buttocks 2014   Heart murmur    Migraines     Family History: Family History  Problem Relation Age of Onset   Breast cancer Mother 4   Hypertension Mother    Hyperlipidemia Mother    Hypertension Father    Thyroid disease Sister     Social History   Socioeconomic History   Marital status: Married    Spouse name: Not on file   Number of children: Not on file   Years of education: Not on file   Highest education level: Not on file  Occupational History   Not on file  Tobacco Use   Smoking status: Never   Smokeless tobacco: Never  Vaping Use   Vaping Use: Never used  Substance and Sexual Activity   Alcohol use: Not Currently    Comment: occasionally   Drug use: No   Sexual activity: Not on file  Other Topics Concern   Not on file  Social History Narrative   Live at home with husband and parents .   Social Determinants of Health   Financial Resource Strain: Not on file  Food Insecurity: Not on file  Transportation Needs: Not on file  Physical Activity: Not on file  Stress: Not on file  Social Connections: Not on file  Intimate Partner Violence: Not on file      Review of Systems  Constitutional:  Negative for chills, fatigue and unexpected weight change.  HENT:  Negative for congestion, postnasal drip, rhinorrhea, sneezing and sore throat.   Eyes:  Negative for redness.  Respiratory:  Negative for cough, chest tightness and shortness of breath.   Cardiovascular:  Negative for chest pain and palpitations.  Gastrointestinal:  Negative for abdominal pain, constipation,  diarrhea, nausea and vomiting.  Genitourinary:  Negative for dysuria and frequency.  Musculoskeletal:  Positive for arthralgias. Negative for back pain, joint swelling and neck pain.  Skin:  Negative for rash.  Neurological: Negative.  Negative for tremors and numbness.  Hematological:  Negative for adenopathy. Does not bruise/bleed easily.  Psychiatric/Behavioral:  Negative for behavioral problems (Depression), sleep disturbance and suicidal ideas. The patient is not nervous/anxious.     Vital Signs: BP (!) 94/58   Pulse 79   Temp 98.2 F (36.8 C)   Resp 16   Ht '5\' 4"'$  (1.626 m)   Wt 128 lb 3.2 oz (58.2 kg)   SpO2 98%   BMI 22.01 kg/m    Physical Exam Vitals and nursing note reviewed.  Constitutional:      General: She is not in acute distress.    Appearance: Normal appearance. She is well-developed and normal weight. She is not diaphoretic.  HENT:     Head: Normocephalic and atraumatic.     Mouth/Throat:     Pharynx: No oropharyngeal exudate.  Eyes:     Pupils: Pupils are equal, round, and reactive to light.  Neck:     Thyroid: No thyromegaly.     Vascular: No JVD.     Trachea: No tracheal deviation.  Cardiovascular:     Rate and Rhythm: Normal rate and regular rhythm.     Heart sounds: Normal heart sounds. No murmur heard.    No friction rub. No gallop.  Pulmonary:     Effort: Pulmonary effort is normal. No respiratory distress.     Breath sounds: No wheezing or rales.  Chest:     Chest wall: No tenderness.  Abdominal:     General: Bowel sounds are normal.     Palpations: Abdomen is soft.  Musculoskeletal:        General: Normal range of motion.     Cervical back: Normal range of motion and neck supple.  Lymphadenopathy:     Cervical: No cervical adenopathy.  Skin:    General: Skin is warm and dry.  Neurological:     Mental Status: She is alert and oriented to person, place, and time.     Cranial Nerves: No cranial nerve deficit.  Psychiatric:         Behavior: Behavior normal.        Thought Content: Thought content normal.        Judgment: Judgment normal.        Assessment/Plan: 1. Internal and external hemorrhoids without complication Followed by GI and general surgery, managing conservatively, but will follow up with surgery if desired  2. Hypotension, unspecified hypotension type Borderline low in office, but is normal for her and asymptomatic. Advised to stay well hydrated   General Counseling: dariella gillihan understanding of the findings of todays visit and agrees with plan of treatment. I have discussed any further diagnostic evaluation that may be  needed or ordered today. We also reviewed her medications today. she has been encouraged to call the office with any questions or concerns that should arise related to todays visit.    No orders of the defined types were placed in this encounter.   No orders of the defined types were placed in this encounter.   This patient was seen by Drema Dallas, PA-C in collaboration with Dr. Clayborn Bigness as a part of collaborative care agreement.   Total time spent:30 Minutes Time spent includes review of chart, medications, test results, and follow up plan with the patient.      Dr Lavera Guise Internal medicine

## 2022-08-05 DIAGNOSIS — M9903 Segmental and somatic dysfunction of lumbar region: Secondary | ICD-10-CM | POA: Diagnosis not present

## 2022-08-05 DIAGNOSIS — M5441 Lumbago with sciatica, right side: Secondary | ICD-10-CM | POA: Diagnosis not present

## 2022-08-12 DIAGNOSIS — M5441 Lumbago with sciatica, right side: Secondary | ICD-10-CM | POA: Diagnosis not present

## 2022-08-12 DIAGNOSIS — M9903 Segmental and somatic dysfunction of lumbar region: Secondary | ICD-10-CM | POA: Diagnosis not present

## 2022-09-11 DIAGNOSIS — M9903 Segmental and somatic dysfunction of lumbar region: Secondary | ICD-10-CM | POA: Diagnosis not present

## 2022-09-11 DIAGNOSIS — M5441 Lumbago with sciatica, right side: Secondary | ICD-10-CM | POA: Diagnosis not present

## 2022-11-04 ENCOUNTER — Encounter: Payer: 59 | Admitting: Physician Assistant

## 2022-11-14 DIAGNOSIS — Z9889 Other specified postprocedural states: Secondary | ICD-10-CM | POA: Diagnosis not present

## 2022-11-14 DIAGNOSIS — H04123 Dry eye syndrome of bilateral lacrimal glands: Secondary | ICD-10-CM | POA: Diagnosis not present

## 2022-11-14 DIAGNOSIS — H52223 Regular astigmatism, bilateral: Secondary | ICD-10-CM | POA: Diagnosis not present

## 2022-11-18 ENCOUNTER — Other Ambulatory Visit: Payer: Self-pay | Admitting: Surgery

## 2022-12-16 ENCOUNTER — Ambulatory Visit: Payer: 59 | Admitting: Physician Assistant

## 2022-12-27 ENCOUNTER — Encounter: Payer: Self-pay | Admitting: Physician Assistant

## 2022-12-27 ENCOUNTER — Ambulatory Visit (INDEPENDENT_AMBULATORY_CARE_PROVIDER_SITE_OTHER): Payer: 59 | Admitting: Physician Assistant

## 2022-12-27 ENCOUNTER — Other Ambulatory Visit: Payer: Self-pay | Admitting: Physician Assistant

## 2022-12-27 VITALS — BP 90/68 | HR 84 | Temp 98.3°F | Resp 16 | Ht 64.0 in | Wt 121.6 lb

## 2022-12-27 DIAGNOSIS — R300 Dysuria: Secondary | ICD-10-CM

## 2022-12-27 DIAGNOSIS — Z124 Encounter for screening for malignant neoplasm of cervix: Secondary | ICD-10-CM | POA: Diagnosis not present

## 2022-12-27 DIAGNOSIS — R7989 Other specified abnormal findings of blood chemistry: Secondary | ICD-10-CM

## 2022-12-27 DIAGNOSIS — Z113 Encounter for screening for infections with a predominantly sexual mode of transmission: Secondary | ICD-10-CM

## 2022-12-27 DIAGNOSIS — E559 Vitamin D deficiency, unspecified: Secondary | ICD-10-CM

## 2022-12-27 DIAGNOSIS — N951 Menopausal and female climacteric states: Secondary | ICD-10-CM | POA: Diagnosis not present

## 2022-12-27 DIAGNOSIS — N6310 Unspecified lump in the right breast, unspecified quadrant: Secondary | ICD-10-CM

## 2022-12-27 DIAGNOSIS — E538 Deficiency of other specified B group vitamins: Secondary | ICD-10-CM | POA: Diagnosis not present

## 2022-12-27 DIAGNOSIS — E7800 Pure hypercholesterolemia, unspecified: Secondary | ICD-10-CM | POA: Diagnosis not present

## 2022-12-27 DIAGNOSIS — N841 Polyp of cervix uteri: Secondary | ICD-10-CM | POA: Diagnosis not present

## 2022-12-27 DIAGNOSIS — Z0001 Encounter for general adult medical examination with abnormal findings: Secondary | ICD-10-CM | POA: Diagnosis not present

## 2022-12-27 DIAGNOSIS — R5383 Other fatigue: Secondary | ICD-10-CM | POA: Diagnosis not present

## 2022-12-27 MED ORDER — VENLAFAXINE HCL ER 37.5 MG PO CP24: ORAL_CAPSULE | ORAL | 2 refills | Status: DC

## 2022-12-27 NOTE — Progress Notes (Signed)
[facility] [address] [place], [place] [address]  Internal MEDICINE  Office Visit Note  Patient Name: Denise Logan  [id number]  [id number]  Date of Service: 12/27/2022  Chief Complaint  Patient presents with   Annual Exam     HPI Pt is here for routine health maintenance examination -having menopausal symptoms--Feels like she is hot/skin burning. Denies significant mood changes. Some sleep difficulty. Doesn't want to take something that might make her groggy as she cares for parents -reports she hs felt something on right breast, possible cyst. It is nontender. She previously had a lumpectomy, but states no cancerous tissue found at that time. This mass is a new finding since then. Will order diagnostic mammo and [place] -due for pap, due for labs  Current Medication: Outpatient Encounter Medications as of 12/27/2022  Medication Sig   butalbital-aspirin-caffeine (FIORINAL) 50-325-40 MG capsule TAKE 1 CAPSULE BY MOUTH EVERY 4 (FOUR) HOURS AS NEEDED FOR HEADACHE.   hydrocortisone 2.5 % cream Apply topically 2 (two) times daily.   lidocaine (XYLOCAINE) 5 % ointment Apply 1 Application topically 4 (four) times daily as needed for mild pain.   Lidocaine, Anorectal, 5 % CREA Apply 1 Application topically in the morning and at bedtime.   venlafaxine XR (EFFEXOR XR) 37.5 MG 24 hr capsule Take 1 capsule by mouth daily.   Vitamin D, Ergocalciferol, (DRISDOL) 1.25 MG (50000 UNIT) CAPS capsule TAKE 1 CAPSULE BY MOUTH ONE TIME PER WEEK   No facility-administered encounter medications on file as of 12/27/2022.    Surgical History: Past Surgical History:  Procedure Laterality Date   BREAST BIOPSY Right 08/08/2021   affirm bx, ribbon marker,COMPATIBLE WITH RADIAL SCAR   BREAST LUMPECTOMY WITH RADIO FREQUENCY LOCALIZER Right 09/04/2021   Procedure: BREAST LUMPECTOMY WITH RADIO FREQUENCY LOCALIZER;  Surgeon: Henrene Dodge, MD;  Location: [facility] ORS;  Service: General;  Laterality: Right;    COLONOSCOPY WITH PROPOFOL N/A 11/22/2021   Procedure: COLONOSCOPY WITH PROPOFOL;  Surgeon: Wyline Mood, MD;  Location: [facility] ENDOSCOPY;  Service: Gastroenterology;  Laterality: N/A;   EYE SURGERY Bilateral    lasik    Medical History: Past Medical History:  Diagnosis Date   Cyst of buttocks 2014   Heart murmur    Migraines     Family History: Family History  Problem Relation Age of Onset   Breast cancer Mother 74   Hypertension Mother    Hyperlipidemia Mother    Hypertension Father    Thyroid disease Sister       Review of Systems  Constitutional:  Negative for chills, fatigue and unexpected weight change.  HENT:  Negative for congestion, postnasal drip, rhinorrhea, sneezing and sore throat.   Eyes:  Negative for redness.  Respiratory:  Negative for cough, chest tightness and shortness of breath.   Cardiovascular:  Negative for chest pain and palpitations.  Gastrointestinal:  Negative for abdominal pain, constipation, diarrhea, nausea and vomiting.  Genitourinary:  Negative for dysuria and frequency.       Hot flashes  Musculoskeletal:  Negative for back pain, joint swelling and neck pain.  Skin:  Negative for rash.  Neurological: Negative.  Negative for tremors and numbness.  Hematological:  Negative for adenopathy. Does not bruise/bleed easily.  Psychiatric/Behavioral:  Negative for behavioral problems (Depression), sleep disturbance and suicidal ideas. The patient is not nervous/anxious.      Vital Signs: BP 90/68   Pulse 84   Temp 98.3 F (36.8 C)   Resp 16   Ht 5\' 4"  (  1.626 m)   Wt 121 lb 9.6 oz (55.2 kg)   SpO2 99%   BMI 20.87 kg/m    Physical Exam Vitals and nursing note reviewed.  Constitutional:      General: She is not in acute distress.    Appearance: Normal appearance. She is well-developed and normal weight. She is not diaphoretic.  HENT:     Head: Normocephalic and atraumatic.     Mouth/Throat:     Pharynx: No oropharyngeal exudate.   Eyes:     Pupils: Pupils are equal, round, and reactive to light.  Neck:     Thyroid: No thyromegaly.     Vascular: No JVD.     Trachea: No tracheal deviation.  Cardiovascular:     Rate and Rhythm: Normal rate and regular rhythm.     Heart sounds: Normal heart sounds. No murmur heard.    No friction rub. No gallop.  Pulmonary:     Effort: Pulmonary effort is normal. No respiratory distress.     Breath sounds: No wheezing or rales.  Chest:     Chest wall: No tenderness.  Breasts:    Right: Mass present. No tenderness.     Left: No tenderness.    Abdominal:     General: Bowel sounds are normal.     Palpations: Abdomen is soft.     Tenderness: There is no abdominal tenderness.  Genitourinary:    Exam position: Lithotomy position.     Cervix: Friability and lesion present.     Musculoskeletal:        General: Normal range of motion.     Cervical back: Normal range of motion and neck supple.  Lymphadenopathy:     Cervical: No cervical adenopathy.  Skin:    General: Skin is warm and dry.  Neurological:     Mental Status: She is alert and oriented to person, place, and time.     Cranial Nerves: No cranial nerve deficit.  Psychiatric:        Behavior: Behavior normal.        Thought Content: Thought content normal.        Judgment: Judgment normal.      LABS: No results found for this or any previous visit (from the past 2160 hour(s)).      Assessment/Plan: 1. Encounter for general adult medical examination with abnormal findings CPE performed, routine fasting labs ordered, mammogram ordered and Pap performed  2. Menopausal symptoms Will start on Effexor and titrate as needed - venlafaxine XR (EFFEXOR XR) 37.5 MG 24 hr capsule; Take 1 capsule by mouth daily.  Dispense: 30 capsule; Refill: 2  3. Mass of right breast, unspecified quadrant Will order diagnostic mammogram with ultrasound for further evaluation - [place] LIMITED ULTRASOUND INCLUDING AXILLA RIGHT  BREAST; Future - MM 3D DIAGNOSTIC MAMMOGRAM BILATERAL BREAST; Future  4. B12 deficiency - B12 and Folate Panel  5. Hypercholesterolemia - Lipid Panel With LDL/HDL Ratio  6. Vitamin D deficiency - VITAMIN D 25 Hydroxy (Vit-D Deficiency, Fractures)  7. Abnormal thyroid blood test - TSH + free T4  8. Polyp at cervical os Small red lesion along cervical os, likely polyp.  Will await Pap results to determine next steps  9. Routine cervical smear - IGP, Aptima HPV  10. Routine screening for STI (sexually transmitted infection) - NuSwab Vaginitis Plus (VG+)  11. Other fatigue - CBC w/Diff/Platelet - Comprehensive metabolic panel - TSH + free T4 - Lipid Panel With LDL/HDL Ratio - B12 and Folate  Panel - VITAMIN D 25 Hydroxy (Vit-D Deficiency, Fractures)  12. Dysuria - UA/M w/rflx Culture, Routine   General Counseling: Murrell verbalizes understanding of the findings of todays visit and agrees with plan of treatment. I have discussed any further diagnostic evaluation that may be needed or ordered today. We also reviewed her medications today. she has been encouraged to call the office with any questions or concerns that should arise related to todays visit.    Counseling:    Orders Placed This Encounter  Procedures   [place] LIMITED ULTRASOUND INCLUDING AXILLA RIGHT BREAST   MM 3D DIAGNOSTIC MAMMOGRAM BILATERAL BREAST   CBC w/Diff/Platelet   Comprehensive metabolic panel   TSH + free T4   Lipid Panel With LDL/HDL Ratio   B12 and Folate Panel   VITAMIN D 25 Hydroxy (Vit-D Deficiency, Fractures)   UA/M w/rflx Culture, Routine   NuSwab Vaginitis Plus (VG+)    Meds ordered this encounter  Medications   venlafaxine XR (EFFEXOR XR) 37.5 MG 24 hr capsule    Sig: Take 1 capsule by mouth daily.    Dispense:  30 capsule    Refill:  2    This patient was seen by Lynn Ito, PA-C in collaboration with Dr. Beverely Risen as a part of collaborative care agreement.  Total time  spent:35 Minutes  Time spent includes review of chart, medications, test results, and follow up plan with the patient.     Lyndon Code, MD  Internal Medicine

## 2022-12-28 LAB — LIPID PANEL WITH LDL/HDL RATIO
Cholesterol, Total: 243 mg/dL — ABNORMAL HIGH (ref 100–199)
HDL: 82 mg/dL (ref >39)
LDL Chol Calc (NIH): 143 mg/dL — ABNORMAL HIGH (ref 0–99)
LDL/HDL Ratio: 1.7 ratio (ref 0.0–3.2)
Triglycerides: 105 mg/dL (ref 0–149)
VLDL Cholesterol Cal: 18 mg/dL (ref 5–40)

## 2022-12-28 LAB — CBC WITH DIFFERENTIAL/PLATELET
Basophils Absolute: 0 10*3/uL (ref 0.0–0.2)
Basos: 1 %
EOS (ABSOLUTE): 0 10*3/uL (ref 0.0–0.4)
Eos: 1 %
Hematocrit: 40.1 % (ref 34.0–46.6)
Hemoglobin: 13 g/dL (ref 11.1–15.9)
Immature Grans (Abs): 0 10*3/uL (ref 0.0–0.1)
Immature Granulocytes: 0 %
Lymphocytes Absolute: 1.4 10*3/uL (ref 0.7–3.1)
Lymphs: 37 %
MCH: 27.4 pg (ref 26.6–33.0)
MCHC: 32.4 g/dL (ref 31.5–35.7)
MCV: 85 fL (ref 79–97)
Monocytes Absolute: 0.4 10*3/uL (ref 0.1–0.9)
Monocytes: 10 %
Neutrophils Absolute: 1.9 10*3/uL (ref 1.4–7.0)
Neutrophils: 51 %
Platelets: 291 10*3/uL (ref 150–450)
RBC: 4.74 x10E6/uL (ref 3.77–5.28)
RDW: 12.8 % (ref 11.7–15.4)
WBC: 3.7 10*3/uL (ref 3.4–10.8)

## 2022-12-28 LAB — MICROSCOPIC EXAMINATION
Bacteria, UA: NONE SEEN
Casts: NONE SEEN /lpf
RBC, Urine: NONE SEEN /hpf (ref 0–2)

## 2022-12-28 LAB — B12 AND FOLATE PANEL
Folate: >20 ng/mL (ref >3.0)
Vitamin B-12: 764 pg/mL (ref 232–1245)

## 2022-12-28 LAB — COMPREHENSIVE METABOLIC PANEL
ALT: 15 IU/L (ref 0–32)
AST: 24 IU/L (ref 0–40)
Albumin: 4.8 g/dL (ref 3.8–4.9)
Alkaline Phosphatase: 73 IU/L (ref 44–121)
BUN/Creatinine Ratio: 10 (ref 9–23)
BUN: 8 mg/dL (ref 6–24)
Bilirubin Total: 0.5 mg/dL (ref 0.0–1.2)
CO2: 26 mmol/L (ref 20–29)
Calcium: 10.1 mg/dL (ref 8.7–10.2)
Chloride: 101 mmol/L (ref 96–106)
Creatinine, Ser: 0.78 mg/dL (ref 0.57–1.00)
Globulin, Total: 2.9 g/dL (ref 1.5–4.5)
Glucose: 83 mg/dL (ref 70–99)
Potassium: 4.5 mmol/L (ref 3.5–5.2)
Sodium: 140 mmol/L (ref 134–144)
Total Protein: 7.7 g/dL (ref 6.0–8.5)
eGFR: 92 mL/min/{1.73_m2} (ref >59)

## 2022-12-28 LAB — VITAMIN D 25 HYDROXY (VIT D DEFICIENCY, FRACTURES): Vit D, 25-Hydroxy: 24.7 ng/mL — ABNORMAL LOW (ref 30.0–100.0)

## 2022-12-28 LAB — UA/M W/RFLX CULTURE, ROUTINE
Bilirubin, UA: NEGATIVE
Glucose, UA: NEGATIVE
Ketones, UA: NEGATIVE
Leukocytes,UA: NEGATIVE
Nitrite, UA: NEGATIVE
Protein,UA: NEGATIVE
RBC, UA: NEGATIVE
Specific Gravity, UA: 1.018 (ref 1.005–1.030)
Urobilinogen, Ur: 0.2 mg/dL (ref 0.2–1.0)
pH, UA: 5.5 (ref 5.0–7.5)

## 2022-12-28 LAB — TSH+FREE T4
Free T4: 1.33 ng/dL (ref 0.82–1.77)
TSH: 1.28 u[IU]/mL (ref 0.450–4.500)

## 2022-12-31 LAB — NUSWAB VAGINITIS PLUS (VG+)
Candida albicans, NAA: NEGATIVE
Candida glabrata, NAA: NEGATIVE
Chlamydia trachomatis, NAA: NEGATIVE
Neisseria gonorrhoeae, NAA: NEGATIVE
Trich vag by NAA: NEGATIVE

## 2023-01-03 LAB — IGP, APTIMA HPV: HPV Aptima: NEGATIVE

## 2023-01-10 ENCOUNTER — Other Ambulatory Visit: Payer: Self-pay | Admitting: Physician Assistant

## 2023-01-10 ENCOUNTER — Ambulatory Visit
Admission: RE | Admit: 2023-01-10 | Discharge: 2023-01-10 | Disposition: A | Discharge status: Home / Self Care | Payer: 59 | Source: Ambulatory Visit | Attending: Physician Assistant | Admitting: Physician Assistant

## 2023-01-10 DIAGNOSIS — N841 Polyp of cervix uteri: Secondary | ICD-10-CM

## 2023-01-10 DIAGNOSIS — N6001 Solitary cyst of right breast: Secondary | ICD-10-CM | POA: Diagnosis not present

## 2023-01-10 DIAGNOSIS — R92333 Mammographic heterogeneous density, bilateral breasts: Secondary | ICD-10-CM | POA: Diagnosis not present

## 2023-01-10 DIAGNOSIS — N6310 Unspecified lump in the right breast, unspecified quadrant: Secondary | ICD-10-CM

## 2023-01-10 DIAGNOSIS — N6320 Unspecified lump in the left breast, unspecified quadrant: Secondary | ICD-10-CM

## 2023-01-10 DIAGNOSIS — N6311 Unspecified lump in the right breast, upper outer quadrant: Secondary | ICD-10-CM | POA: Diagnosis not present

## 2023-01-10 DIAGNOSIS — N951 Menopausal and female climacteric states: Secondary | ICD-10-CM

## 2023-01-13 ENCOUNTER — Telehealth: Payer: Self-pay

## 2023-01-13 ENCOUNTER — Other Ambulatory Visit: Payer: Self-pay | Admitting: Physician Assistant

## 2023-01-13 DIAGNOSIS — R928 Other abnormal and inconclusive findings on diagnostic imaging of breast: Secondary | ICD-10-CM

## 2023-01-13 DIAGNOSIS — N6489 Other specified disorders of breast: Secondary | ICD-10-CM

## 2023-01-13 NOTE — Telephone Encounter (Signed)
-----   Message from Carlean Jews sent at 01/10/2023  1:31 PM EDT ----- Please let patient know that her pap came back normal. Given small polyp seen on exam I will send GYN referral for further evaluation and they may further discuss menopausal symptoms if it is not improving on current medication. Her vit D is low and should supplement OTC. Her cholesterol is increased and consider fish oil supplement and going back on 5mg  crestor--unclear why this was stopped so please resend if agreeable

## 2023-01-13 NOTE — Telephone Encounter (Signed)
Spoke with patient regarding lab results. 

## 2023-01-16 ENCOUNTER — Ambulatory Visit
Admission: RE | Admit: 2023-01-16 | Discharge: 2023-01-16 | Disposition: A | Discharge status: Home / Self Care | Payer: 59 | Source: Ambulatory Visit | Attending: Physician Assistant | Admitting: Physician Assistant

## 2023-01-16 DIAGNOSIS — N6321 Unspecified lump in the left breast, upper outer quadrant: Secondary | ICD-10-CM | POA: Diagnosis not present

## 2023-01-16 DIAGNOSIS — R928 Other abnormal and inconclusive findings on diagnostic imaging of breast: Secondary | ICD-10-CM | POA: Diagnosis not present

## 2023-01-16 DIAGNOSIS — N6489 Other specified disorders of breast: Secondary | ICD-10-CM

## 2023-01-29 ENCOUNTER — Encounter: Payer: Self-pay | Admitting: Licensed Practical Nurse

## 2023-01-29 ENCOUNTER — Other Ambulatory Visit (HOSPITAL_COMMUNITY)
Admission: RE | Admit: 2023-01-29 | Discharge: 2023-01-29 | Disposition: A | Discharge status: Home / Self Care | Payer: 59 | Source: Ambulatory Visit | Attending: Obstetrics & Gynecology | Admitting: Obstetrics & Gynecology

## 2023-01-29 ENCOUNTER — Ambulatory Visit: Payer: 59 | Admitting: Licensed Practical Nurse

## 2023-01-29 VITALS — BP 108/74 | HR 67 | Wt 122.2 lb

## 2023-01-29 DIAGNOSIS — N841 Polyp of cervix uteri: Secondary | ICD-10-CM | POA: Insufficient documentation

## 2023-01-29 DIAGNOSIS — R232 Flushing: Secondary | ICD-10-CM | POA: Diagnosis not present

## 2023-01-29 NOTE — Progress Notes (Addendum)
 SUBJECTIVE: Here for removal of cervical polyp and address menopausal symptoms  HPI: Denise Logan was seen for an annual  by her PCP in June, during that visit a cervical polyp was noted so a referral to OB/GYN was made. Denise Logan was unaware that she had a polyp. She has not noticed any bleeding.  Denise Logan last had a cycle May 2023 Denise Logan has been experiencing, hotflashes, she was started on Effexor  about 1 month ago. She has noticed she is more relaxed and her hotflashes are better but still occur unpredictably. She does not use tobacco products, denies hx CHTN. She does have a hx of Migraines.   ROS: Constitutional: Fatigue,  Eyes: Changes in Vision (recently has change to her prescription glasses) Breasts:Breast lumps and tenderness (has had US  and Mammogram, has cysts) Neurological:Dizziness, Tingling/numbness, Headaches Muscle/Skeletal: Joint pain Hormones:Hot flashes, hot/cold intolerance  OB hx G2P2     Current Outpatient Medications on File Prior to Visit  Medication Sig Dispense Refill   butalbital -aspirin-caffeine  (FIORINAL) 50-325-40 MG capsule TAKE 1 CAPSULE BY MOUTH EVERY 4 (FOUR) HOURS AS NEEDED FOR HEADACHE. 45 capsule 1   Cholecalciferol (VITAMIN D ) 50 MCG (2000 UT) CAPS Take by mouth.     venlafaxine  XR (EFFEXOR  XR) 37.5 MG 24 hr capsule Take 1 capsule by mouth daily. 30 capsule 2   Vitamin D , Ergocalciferol , (DRISDOL ) 1.25 MG (50000 UNIT) CAPS capsule TAKE 1 CAPSULE BY MOUTH ONE TIME PER WEEK 4 capsule 0   hydrocortisone  2.5 % cream Apply topically 2 (two) times daily. (Patient not taking: Reported on 01/29/2023)     lidocaine  (XYLOCAINE ) 5 % ointment Apply 1 Application topically 4 (four) times daily as needed for mild pain. (Patient not taking: Reported on 01/29/2023) 30 g 0   Lidocaine , Anorectal, 5 % CREA Apply 1 Application topically in the morning and at bedtime. (Patient not taking: Reported on 01/29/2023) 15 g 0   No current facility-administered medications on file prior to  visit.    OBJECTIVE: BP 108/74   Pulse 67   Wt 122 lb 3.2 oz (55.4 kg)   LMP 11/12/2021   BMI 20.98 kg/m   Physical Exam Constitutional:      Appearance: Normal appearance.  Cardiovascular:     Rate and Rhythm: Normal rate.  Pulmonary:     Effort: Pulmonary effort is normal.  Genitourinary:    General: Normal vulva.     Comments: SSE: about half cm polyp present at cervical os. Polyp grasped with ring forceps and easily removed by a twisting motion. Minimal bleeding noted. Pt tolerated well.  Musculoskeletal:        General: Normal range of motion.  Skin:    General: Skin is warm.  Neurological:     General: No focal deficit present.     Mental Status: She is alert.  Psychiatric:        Mood and Affect: Mood normal.      In office procedure: Risk/benefits of polyp removal discussed. Consent acquired prior to procedure   about half cm polyp present at cervical os. Polyp grasped with ring forceps and easily removed by a twisting motion. Minimal bleeding noted. Pt tolerated well.  -Jinnie Cookey, CCNM    ASSESSMENT/PLAN: Cervical Polyp -sent to pathology, offered reassurance to pt of low chance of malignancy   Menopausal symptoms -Reviewed many of the symptoms she reported are not associated with menopause, some may be due to aging. Her recent TSH was normal. Encouraged pt to see PCP for concerning symptoms not  associated with menopause. The pt is seeing improvement In mood and hotflashes-encouraged pt to try Effexor  for another 2 months to see if she has an improvement in symptoms, if after 2 months she does not see improvement she could consider increasing the Effexor  dose or adding another medication.      Jinnie Cookey, CNM    Medical Group  01/29/23  2:29 PM

## 2023-02-04 ENCOUNTER — Telehealth: Payer: Self-pay

## 2023-02-04 NOTE — Telephone Encounter (Signed)
Pt called she had cyst on her right breast and its Sore and she  came before they  did ultrasound for breast 2 months ago is normal and now she its sore advised her that make her appt tomorrow but if its worse go to ED or urgent care

## 2023-02-05 ENCOUNTER — Ambulatory Visit: Payer: 59 | Admitting: Nurse Practitioner

## 2023-02-05 ENCOUNTER — Encounter: Payer: Self-pay | Admitting: Nurse Practitioner

## 2023-02-05 VITALS — BP 110/76 | HR 79 | Temp 98.3°F | Resp 16 | Ht 64.0 in | Wt 124.8 lb

## 2023-02-05 DIAGNOSIS — N6001 Solitary cyst of right breast: Secondary | ICD-10-CM | POA: Diagnosis not present

## 2023-02-05 NOTE — Progress Notes (Signed)
[facility] [address] [place], [place] [address]  Internal MEDICINE  Office Visit Note  Patient Name: Denise Logan  [id number]  [id number]  Date of Service: 02/05/2023  Chief Complaint  Patient presents with   Acute Visit    Cyst on right breast     HPI Seerit presents for an acute sick visit for pain lump/cyst of right breast Cysts come and go and this one has not gone away.  Painful to touch and without palpation, firm but movable.  Had recent diagnostic mammogram and bilateral breast ultrasound in July.      Current Medication:  Outpatient Encounter Medications as of 02/05/2023  Medication Sig   butalbital-aspirin-caffeine (FIORINAL) 50-325-40 MG capsule TAKE 1 CAPSULE BY MOUTH EVERY 4 (FOUR) HOURS AS NEEDED FOR HEADACHE.   Cholecalciferol (VITAMIN D) 50 MCG (2000 UT) CAPS Take by mouth.   hydrocortisone 2.5 % cream Apply topically 2 (two) times daily.   lidocaine (XYLOCAINE) 5 % ointment Apply 1 Application topically 4 (four) times daily as needed for mild pain.   Lidocaine, Anorectal, 5 % CREA Apply 1 Application topically in the morning and at bedtime.   venlafaxine XR (EFFEXOR XR) 37.5 MG 24 hr capsule Take 1 capsule by mouth daily.   Vitamin D, Ergocalciferol, (DRISDOL) 1.25 MG (50000 UNIT) CAPS capsule TAKE 1 CAPSULE BY MOUTH ONE TIME PER WEEK   No facility-administered encounter medications on file as of 02/05/2023.      Medical History: Past Medical History:  Diagnosis Date   Cyst of buttocks 2014   Heart murmur    Migraines      Vital Signs: BP 110/76   Pulse 79   Temp 98.3 F (36.8 C)   Resp 16   Ht 5\' 4"  (1.626 m)   Wt 124 lb 12.8 oz (56.6 kg)   LMP 11/12/2021   SpO2 98%   BMI 21.42 kg/m    Review of Systems  Constitutional:  Negative for chills, fatigue and unexpected weight change.  HENT:  Negative for congestion, postnasal drip, rhinorrhea, sneezing and sore throat.   Eyes:  Negative for redness.  Respiratory:  Negative for  cough, chest tightness and shortness of breath.   Cardiovascular:  Negative for chest pain and palpitations.  Gastrointestinal:  Negative for abdominal pain, constipation, diarrhea, nausea and vomiting.  Genitourinary:  Negative for dysuria and frequency.  Musculoskeletal:  Negative for arthralgias, back pain, joint swelling and neck pain.  Skin:  Negative for rash.  Neurological: Negative.  Negative for tremors and numbness.  Hematological:  Negative for adenopathy. Does not bruise/bleed easily.  Psychiatric/Behavioral:  Negative for behavioral problems (Depression), sleep disturbance and suicidal ideas. The patient is not nervous/anxious.     Physical Exam Chest:  Breasts:    Breasts are symmetrical.     Right: Mass and tenderness present. No swelling, bleeding, inverted nipple, nipple discharge or skin change.     Left: Mass (identified on imaging -- benign in july 2024) present. No swelling, bleeding, inverted nipple, nipple discharge, skin change or tenderness.       Comments: Location of movable cyst. Painful to palpation Lymphadenopathy:     Upper Body:     Right upper body: No supraclavicular, axillary or pectoral adenopathy.     Left upper body: No supraclavicular, axillary or pectoral adenopathy.       Assessment/Plan: 1. Cyst of right breast Referred to general surgery for further evaluation and treatment - Ambulatory referral to General Surgery   General  Counseling: candia isley understanding of the findings of todays visit and agrees with plan of treatment. I have discussed any further diagnostic evaluation that may be needed or ordered today. We also reviewed her medications today. she has been encouraged to call the office with any questions or concerns that should arise related to todays visit.    Counseling:    Orders Placed This Encounter  Procedures   Ambulatory referral to General Surgery    No orders of the defined types were placed in this  encounter.   Return if symptoms worsen or fail to improve.  [place] Controlled Substance Database was reviewed by me for overdose risk score (ORS)  Time spent:20 Minutes Time spent with patient included reviewing progress notes, labs, imaging studies, and discussing plan for follow up.   This patient was seen by Sallyanne Kuster, FNP-C in collaboration with Dr. Beverely Risen as a part of collaborative care agreement.   R. Tedd Sias, MSN, FNP-C Internal Medicine

## 2023-02-06 ENCOUNTER — Other Ambulatory Visit: Payer: Self-pay | Admitting: Physician Assistant

## 2023-02-06 ENCOUNTER — Encounter: Payer: Self-pay | Admitting: Obstetrics and Gynecology

## 2023-02-06 DIAGNOSIS — G9389 Other specified disorders of brain: Secondary | ICD-10-CM

## 2023-02-07 ENCOUNTER — Other Ambulatory Visit: Payer: Self-pay | Admitting: Physician Assistant

## 2023-02-07 ENCOUNTER — Ambulatory Visit: Payer: 59 | Admitting: Surgery

## 2023-02-07 DIAGNOSIS — N6001 Solitary cyst of right breast: Secondary | ICD-10-CM

## 2023-02-13 ENCOUNTER — Ambulatory Visit
Admission: RE | Admit: 2023-02-13 | Discharge: 2023-02-13 | Disposition: A | Discharge status: Home / Self Care | Payer: 59 | Source: Ambulatory Visit | Attending: Physician Assistant | Admitting: Physician Assistant

## 2023-02-13 DIAGNOSIS — N6001 Solitary cyst of right breast: Secondary | ICD-10-CM | POA: Insufficient documentation

## 2023-02-13 DIAGNOSIS — N6311 Unspecified lump in the right breast, upper outer quadrant: Secondary | ICD-10-CM | POA: Diagnosis not present

## 2023-02-13 DIAGNOSIS — N644 Mastodynia: Secondary | ICD-10-CM | POA: Diagnosis not present

## 2023-02-24 ENCOUNTER — Encounter: Payer: 59 | Admitting: Obstetrics & Gynecology

## 2023-03-02 DIAGNOSIS — Z20822 Contact with and (suspected) exposure to covid-19: Secondary | ICD-10-CM | POA: Diagnosis not present

## 2023-03-02 DIAGNOSIS — U071 COVID-19: Secondary | ICD-10-CM | POA: Diagnosis not present

## 2023-03-02 DIAGNOSIS — J029 Acute pharyngitis, unspecified: Secondary | ICD-10-CM | POA: Diagnosis not present

## 2023-03-03 ENCOUNTER — Telehealth: Payer: Self-pay | Admitting: Nurse Practitioner

## 2023-03-03 NOTE — Telephone Encounter (Signed)
Patient's husband called stating patient went to urgent care. Diagnosed with covid. She is on 200 mg of benzonatate, what can she take for fever. Per AA patient to take either tylenol or ibprofen, I scheduled patient for virtual appointment-Toni

## 2023-03-04 ENCOUNTER — Telehealth: Payer: 59 | Admitting: Internal Medicine

## 2023-03-04 VITALS — Temp 98.2°F | Ht 64.0 in | Wt 124.0 lb

## 2023-03-04 DIAGNOSIS — R110 Nausea: Secondary | ICD-10-CM | POA: Diagnosis not present

## 2023-03-04 DIAGNOSIS — J208 Acute bronchitis due to other specified organisms: Secondary | ICD-10-CM | POA: Diagnosis not present

## 2023-03-04 DIAGNOSIS — U071 COVID-19: Secondary | ICD-10-CM | POA: Diagnosis not present

## 2023-03-04 MED ORDER — ONDANSETRON HCL 4 MG PO TABS: 4.0000 mg | ORAL_TABLET | Freq: Three times a day (TID) | ORAL | 0 refills | Status: DC | PRN

## 2023-03-04 MED ORDER — NIRMATRELVIR/RITONAVIR (PAXLOVID)TABLET
3.0000 | ORAL_TABLET | Freq: Two times a day (BID) | ORAL | 0 refills | Status: AC
Start: 2023-03-04 — End: 2023-03-09

## 2023-03-04 NOTE — Progress Notes (Signed)
[facility] [address] [place], [place] [address]  Internal MEDICINE  Telephone Visit  Patient Name: Denise Logan  [id number]  [id number]  Date of Service: 03/19/2023  I connected with the patient at 1015am by telephone and verified the patients identity using two identifiers.   I discussed the limitations, risks, security and privacy concerns of performing an evaluation and management service by telephone and the availability of in person appointments. I also discussed with the patient that there may be a patient responsible charge related to the service.  The patient expressed understanding and agrees to proceed.    Chief Complaint  Patient presents with   Telephone Assessment    [id number]   Telephone Screen    Covid positive symptoms start Sunday    Cough   Sore Throat   Sinusitis   Headache   Chills   Fever    HPI Dad is sick with covid on Saturday Vaccinated x 3  She started to feel fatigued, sore throat and cough  Tested positive for Covid as well C/O nausea     Current Medication: Outpatient Encounter Medications as of 03/04/2023  Medication Sig   butalbital-aspirin-caffeine (FIORINAL) 50-325-40 MG capsule TAKE 1 CAPSULE BY MOUTH EVERY 4 (FOUR) HOURS AS NEEDED FOR HEADACHE.   Cholecalciferol (VITAMIN D) 50 MCG (2000 UT) CAPS Take by mouth.   hydrocortisone 2.5 % cream Apply topically 2 (two) times daily.   lidocaine (XYLOCAINE) 5 % ointment Apply 1 Application topically 4 (four) times daily as needed for mild pain.   Lidocaine, Anorectal, 5 % CREA Apply 1 Application topically in the morning and at bedtime.   [EXPIRED] nirmatrelvir/ritonavir (PAXLOVID) 20 x 150 MG & 10 x 100MG  TABS Take 3 tablets by mouth 2 (two) times daily for 5 days. Normal   ondansetron (ZOFRAN) 4 MG tablet Take 1 tablet (4 mg total) by mouth every 8 (eight) hours as needed for nausea or vomiting.   venlafaxine XR (EFFEXOR XR) 37.5 MG 24 hr capsule Take 1 capsule by mouth daily.    Vitamin D, Ergocalciferol, (DRISDOL) 1.25 MG (50000 UNIT) CAPS capsule TAKE 1 CAPSULE BY MOUTH ONE TIME PER WEEK   No facility-administered encounter medications on file as of 03/04/2023.    Surgical History: Past Surgical History:  Procedure Laterality Date   BREAST BIOPSY Right 08/08/2021   affirm bx, ribbon marker,COMPATIBLE WITH RADIAL SCAR   BREAST LUMPECTOMY WITH RADIO FREQUENCY LOCALIZER Right 09/04/2021   Procedure: BREAST LUMPECTOMY WITH RADIO FREQUENCY LOCALIZER;  Surgeon: Henrene Dodge, MD;  Location: [facility] ORS;  Service: General;  Laterality: Right;   COLONOSCOPY WITH PROPOFOL N/A 11/22/2021   Procedure: COLONOSCOPY WITH PROPOFOL;  Surgeon: Wyline Mood, MD;  Location: [facility] ENDOSCOPY;  Service: Gastroenterology;  Laterality: N/A;   EYE SURGERY Bilateral    lasik    Medical History: Past Medical History:  Diagnosis Date   Cyst of buttocks 2014   Heart murmur    Migraines     Family History: Family History  Problem Relation Age of Onset   Breast cancer Mother 47   Hypertension Mother    Hyperlipidemia Mother    Hypertension Father    Thyroid disease Sister     Social History   Socioeconomic History   Marital status: Married    Spouse name: Not on file   Number of children: Not on file   Years of education: Not on file   Highest education level: Not on file  Occupational History   Not on  file  Tobacco Use   Smoking status: Never   Smokeless tobacco: Never  Vaping Use   Vaping status: Never Used  Substance and Sexual Activity   Alcohol use: Not Currently    Comment: occasionally   Drug use: No   Sexual activity: Yes    Birth control/protection: None  Other Topics Concern   Not on file  Social History Narrative   Live at home with husband and parents .   Social Determinants of Health   Financial Resource Strain: Not on file  Food Insecurity: Not on file  Transportation Needs: Not on file  Physical Activity: Not on file  Stress: Not on file  Social  Connections: Not on file  Intimate Partner Violence: Not on file      Review of Systems  Constitutional:  Negative for fatigue and fever.  HENT:  Negative for congestion, mouth sores and postnasal drip.   Respiratory:  Positive for cough.   Cardiovascular:  Negative for chest pain.  Genitourinary:  Negative for flank pain.  Psychiatric/Behavioral: Negative.      Vital Signs: Temp 98.2 F (36.8 C)   Ht 5\' 4"  (1.626 m)   Wt 124 lb (56.2 kg)   LMP 11/12/2021   BMI 21.28 kg/m    Observation/Objective: Looks ill     Assessment/Plan: 1. Acute bronchitis due to COVID-19 virus Will treat with antiviral therapy  - nirmatrelvir/ritonavir (PAXLOVID) 20 x 150 MG & 10 x 100MG  TABS; Take 3 tablets by mouth 2 (two) times daily for 5 days. Normal  Dispense: 30 tablet; Refill: 0  2. Moderate nausea Encouraged hydration, supportive care  - ondansetron (ZOFRAN) 4 MG tablet; Take 1 tablet (4 mg total) by mouth every 8 (eight) hours as needed for nausea or vomiting.  Dispense: 20 tablet; Refill: 0   General Counseling: Saphyra verbalizes understanding of the findings of today's phone visit and agrees with plan of treatment. I have discussed any further diagnostic evaluation that may be needed or ordered today. We also reviewed her medications today. she has been encouraged to call the office with any questions or concerns that should arise related to todays visit.   Meds ordered this encounter  Medications   nirmatrelvir/ritonavir (PAXLOVID) 20 x 150 MG & 10 x 100MG  TABS    Sig: Take 3 tablets by mouth 2 (two) times daily for 5 days. Normal    Dispense:  30 tablet    Refill:  0   ondansetron (ZOFRAN) 4 MG tablet    Sig: Take 1 tablet (4 mg total) by mouth every 8 (eight) hours as needed for nausea or vomiting.    Dispense:  20 tablet    Refill:  0    Time spent:15 Minutes    Dr Lyndon Code Internal medicine

## 2023-03-20 ENCOUNTER — Encounter: Payer: Self-pay | Admitting: Physician Assistant

## 2023-03-20 ENCOUNTER — Ambulatory Visit: Payer: 59 | Admitting: Physician Assistant

## 2023-03-20 VITALS — BP 115/65 | HR 88 | Temp 98.4°F | Resp 16 | Ht 64.0 in | Wt 120.8 lb

## 2023-03-20 DIAGNOSIS — N6001 Solitary cyst of right breast: Secondary | ICD-10-CM

## 2023-03-20 DIAGNOSIS — E7800 Pure hypercholesterolemia, unspecified: Secondary | ICD-10-CM

## 2023-03-20 DIAGNOSIS — N951 Menopausal and female climacteric states: Secondary | ICD-10-CM | POA: Diagnosis not present

## 2023-03-20 NOTE — Progress Notes (Signed)
[facility] [address] [place], [place] [address]  Internal MEDICINE  Office Visit Note  Patient Name: Denise Logan  [id number]  [id number]  Date of Service: 03/20/2023  Chief Complaint  Patient presents with   Follow-up    HPI Pt is here for routine follow up -Recovered from covid, no SOB, wheezing or coughing now and is feeling well. -Stopped taking all medication when sick. Did have a random bruise pop up on leg as well and decided to stop meds while sick.  -taking magnesium, Vit D, and fish oil supplement now. Does not do well with medication anyway. She may follow up with GYN for menopausal symptoms if she wants to discuss further, but when she saw them for cervical polyp removal they had advised she continue the venlafaxine at that time which she has since stopped. -breast cyst went down in size again so did not have aspiration, not as bad now  Current Medication: Outpatient Encounter Medications as of 03/20/2023  Medication Sig   butalbital-aspirin-caffeine (FIORINAL) 50-325-40 MG capsule TAKE 1 CAPSULE BY MOUTH EVERY 4 (FOUR) HOURS AS NEEDED FOR HEADACHE.   Cholecalciferol (VITAMIN D) 50 MCG (2000 UT) CAPS Take by mouth.   hydrocortisone 2.5 % cream Apply topically 2 (two) times daily.   lidocaine (XYLOCAINE) 5 % ointment Apply 1 Application topically 4 (four) times daily as needed for mild pain.   Lidocaine, Anorectal, 5 % CREA Apply 1 Application topically in the morning and at bedtime.   ondansetron (ZOFRAN) 4 MG tablet Take 1 tablet (4 mg total) by mouth every 8 (eight) hours as needed for nausea or vomiting.   venlafaxine XR (EFFEXOR XR) 37.5 MG 24 hr capsule Take 1 capsule by mouth daily.   Vitamin D, Ergocalciferol, (DRISDOL) 1.25 MG (50000 UNIT) CAPS capsule TAKE 1 CAPSULE BY MOUTH ONE TIME PER WEEK   No facility-administered encounter medications on file as of 03/20/2023.    Surgical History: Past Surgical History:  Procedure Laterality Date   BREAST  BIOPSY Right 08/08/2021   affirm bx, ribbon marker,COMPATIBLE WITH RADIAL SCAR   BREAST LUMPECTOMY WITH RADIO FREQUENCY LOCALIZER Right 09/04/2021   Procedure: BREAST LUMPECTOMY WITH RADIO FREQUENCY LOCALIZER;  Surgeon: Henrene Dodge, MD;  Location: [facility] ORS;  Service: General;  Laterality: Right;   COLONOSCOPY WITH PROPOFOL N/A 11/22/2021   Procedure: COLONOSCOPY WITH PROPOFOL;  Surgeon: Wyline Mood, MD;  Location: [facility] ENDOSCOPY;  Service: Gastroenterology;  Laterality: N/A;   EYE SURGERY Bilateral    lasik    Medical History: Past Medical History:  Diagnosis Date   Cyst of buttocks 2014   Heart murmur    Migraines     Family History: Family History  Problem Relation Age of Onset   Breast cancer Mother 76   Hypertension Mother    Hyperlipidemia Mother    Hypertension Father    Thyroid disease Sister     Social History   Socioeconomic History   Marital status: Married    Spouse name: Not on file   Number of children: Not on file   Years of education: Not on file   Highest education level: Not on file  Occupational History   Not on file  Tobacco Use   Smoking status: Never   Smokeless tobacco: Never  Vaping Use   Vaping status: Never Used  Substance and Sexual Activity   Alcohol use: Not Currently    Comment: occasionally   Drug use: No   Sexual activity: Yes    Birth  control/protection: None  Other Topics Concern   Not on file  Social History Narrative   Live at home with husband and parents .   Social Determinants of Health   Financial Resource Strain: Not on file  Food Insecurity: Not on file  Transportation Needs: Not on file  Physical Activity: Not on file  Stress: Not on file  Social Connections: Not on file  Intimate Partner Violence: Not on file      Review of Systems  Constitutional:  Negative for chills, fatigue and unexpected weight change.  HENT:  Negative for congestion, postnasal drip, rhinorrhea, sneezing and sore throat.   Eyes:   Negative for redness.  Respiratory:  Negative for cough, chest tightness and shortness of breath.   Cardiovascular:  Negative for chest pain and palpitations.  Gastrointestinal:  Negative for abdominal pain, constipation, diarrhea, nausea and vomiting.  Genitourinary:  Negative for dysuria and frequency.  Musculoskeletal:  Negative for arthralgias, back pain, joint swelling and neck pain.  Skin:  Negative for rash.  Neurological: Negative.  Negative for tremors and numbness.  Hematological:  Negative for adenopathy. Does not bruise/bleed easily.  Psychiatric/Behavioral:  Negative for behavioral problems (Depression), sleep disturbance and suicidal ideas. The patient is not nervous/anxious.     Vital Signs: BP 115/65   Pulse 88   Temp 98.4 F (36.9 C)   Resp 16   Ht 5\' 4"  (1.626 m)   Wt 120 lb 12.8 oz (54.8 kg)   LMP 11/12/2021   SpO2 99%   BMI 20.74 kg/m    Physical Exam Vitals and nursing note reviewed.  Constitutional:      General: She is not in acute distress.    Appearance: Normal appearance. She is well-developed and normal weight. She is not diaphoretic.  HENT:     Head: Normocephalic and atraumatic.     Mouth/Throat:     Pharynx: No oropharyngeal exudate.  Eyes:     Pupils: Pupils are equal, round, and reactive to light.  Neck:     Thyroid: No thyromegaly.     Vascular: No JVD.     Trachea: No tracheal deviation.  Cardiovascular:     Rate and Rhythm: Normal rate and regular rhythm.     Heart sounds: Normal heart sounds. No murmur heard.    No friction rub. No gallop.  Pulmonary:     Effort: Pulmonary effort is normal. No respiratory distress.     Breath sounds: No wheezing or rales.  Chest:     Chest wall: No tenderness.  Abdominal:     General: Bowel sounds are normal.     Palpations: Abdomen is soft.  Musculoskeletal:        General: Normal range of motion.     Cervical back: Normal range of motion and neck supple.  Lymphadenopathy:     Cervical: No  cervical adenopathy.  Skin:    General: Skin is warm and dry.  Neurological:     Mental Status: She is alert and oriented to person, place, and time.     Cranial Nerves: No cranial nerve deficit.  Psychiatric:        Behavior: Behavior normal.        Thought Content: Thought content normal.        Judgment: Judgment normal.        Assessment/Plan: 1. Menopausal symptoms Stopped effexor- doesn't like taking meds. May reconsider in future, may discuss with GYN as well  2. Cyst of right breast Improved without  aspiration and will monitor  3. Hypercholesterolemia Working on diet and exercise and started fish oil.   General Counseling: jessaca beh understanding of the findings of todays visit and agrees with plan of treatment. I have discussed any further diagnostic evaluation that may be needed or ordered today. We also reviewed her medications today. she has been encouraged to call the office with any questions or concerns that should arise related to todays visit.    No orders of the defined types were placed in this encounter.   No orders of the defined types were placed in this encounter.   This patient was seen by Lynn Ito, PA-C in collaboration with Dr. Beverely Risen as a part of collaborative care agreement.   Total time spent:30 Minutes Time spent includes review of chart, medications, test results, and follow up plan with the patient.      Dr Lyndon Code Internal medicine

## 2023-03-22 ENCOUNTER — Other Ambulatory Visit: Payer: Self-pay | Admitting: Physician Assistant

## 2023-03-22 DIAGNOSIS — N951 Menopausal and female climacteric states: Secondary | ICD-10-CM

## 2023-05-27 ENCOUNTER — Other Ambulatory Visit: Payer: Self-pay | Admitting: Internal Medicine

## 2023-05-27 DIAGNOSIS — R519 Headache, unspecified: Secondary | ICD-10-CM

## 2023-09-01 IMAGING — MG MM DIGITAL DIAGNOSTIC UNILAT*R* W/ TOMO W/ CAD
6 series · 6 of 18 positions shown · non-contrast
Comparison: Previous exam(s).

CLINICAL DATA: Callback for RIGHT breast distortion

EXAM:
DIGITAL DIAGNOSTIC UNILATERAL RIGHT MAMMOGRAM WITH TOMOSYNTHESIS AND
CAD; ULTRASOUND RIGHT BREAST LIMITED
TECHNIQUE: Right digital diagnostic mammography and breast tomosynthesis was
performed. The images were evaluated with computer-aided detection.;
Targeted ultrasound examination of the right breast was performed

[R CC synth-2D]
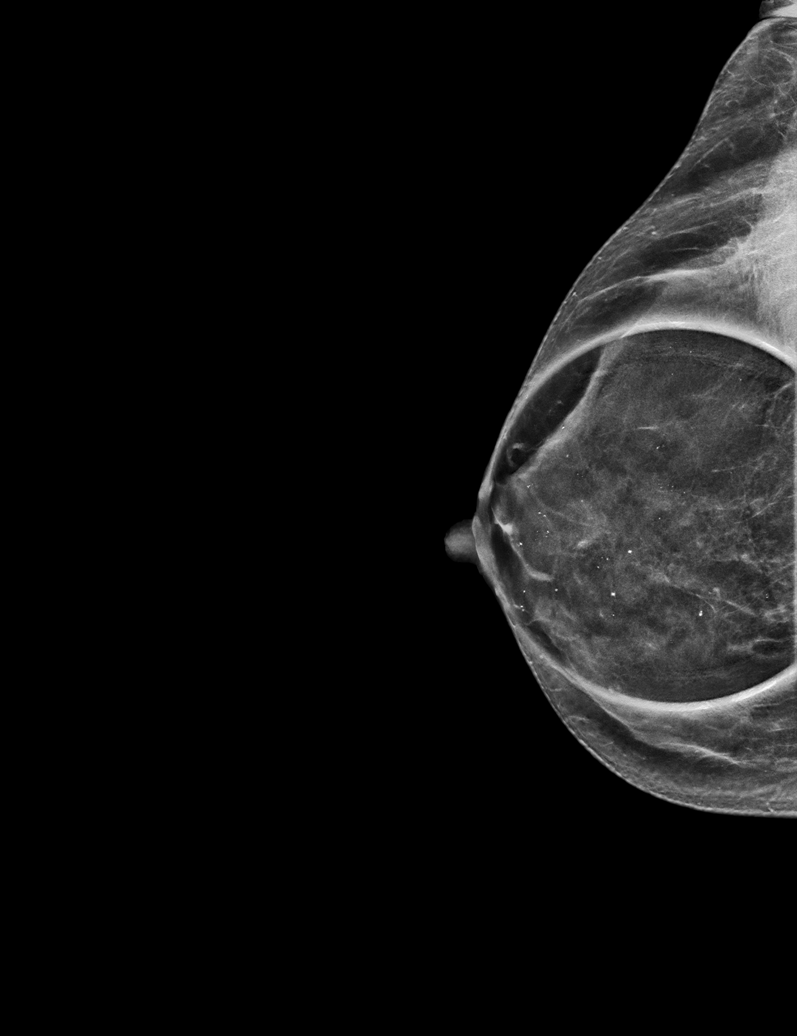

[R MLO synth-2D]
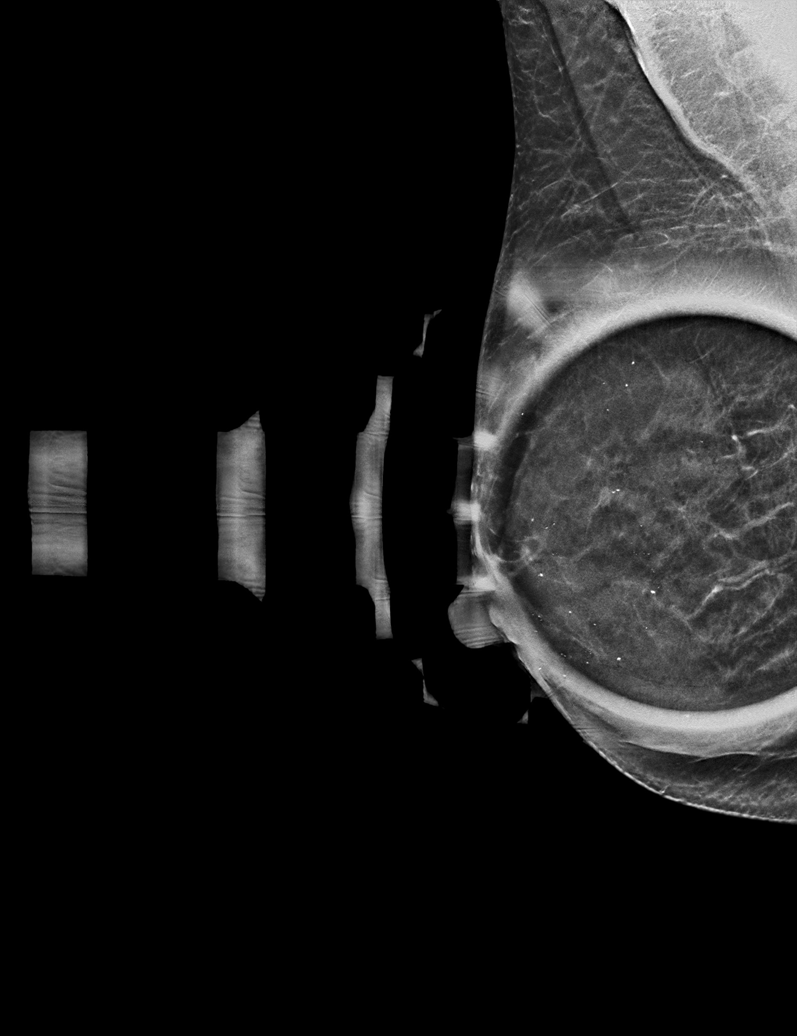

[R ML synth-2D]
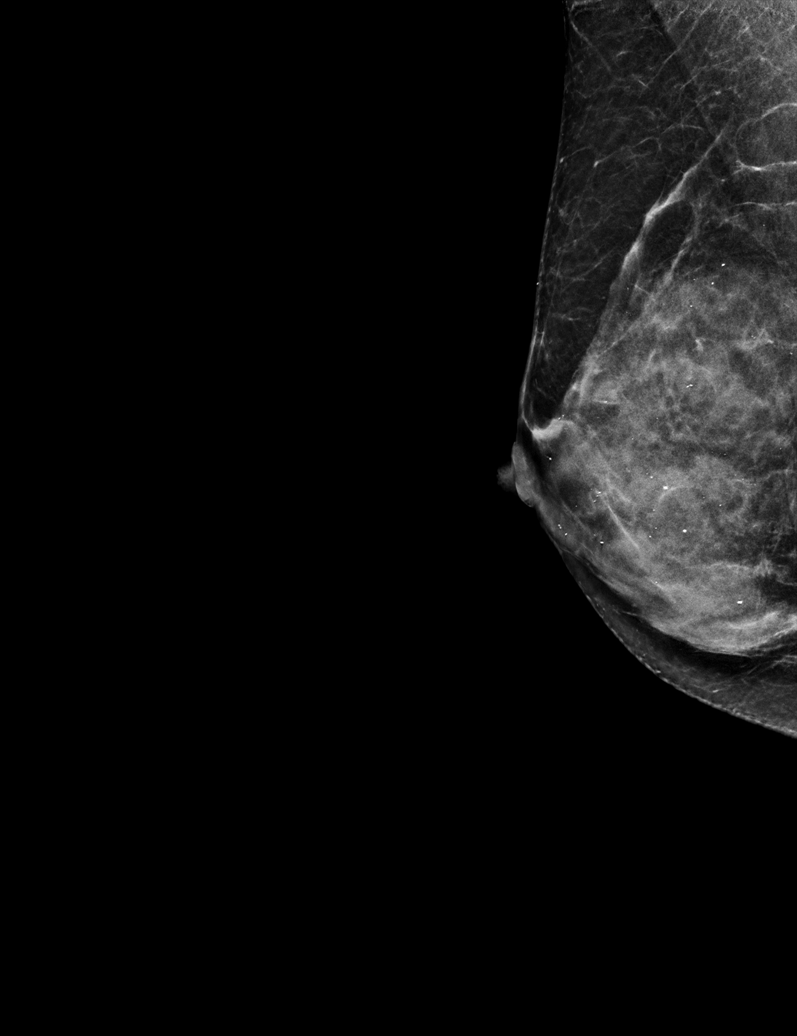

[R MLO tomo · tomo slice 23/44.0]
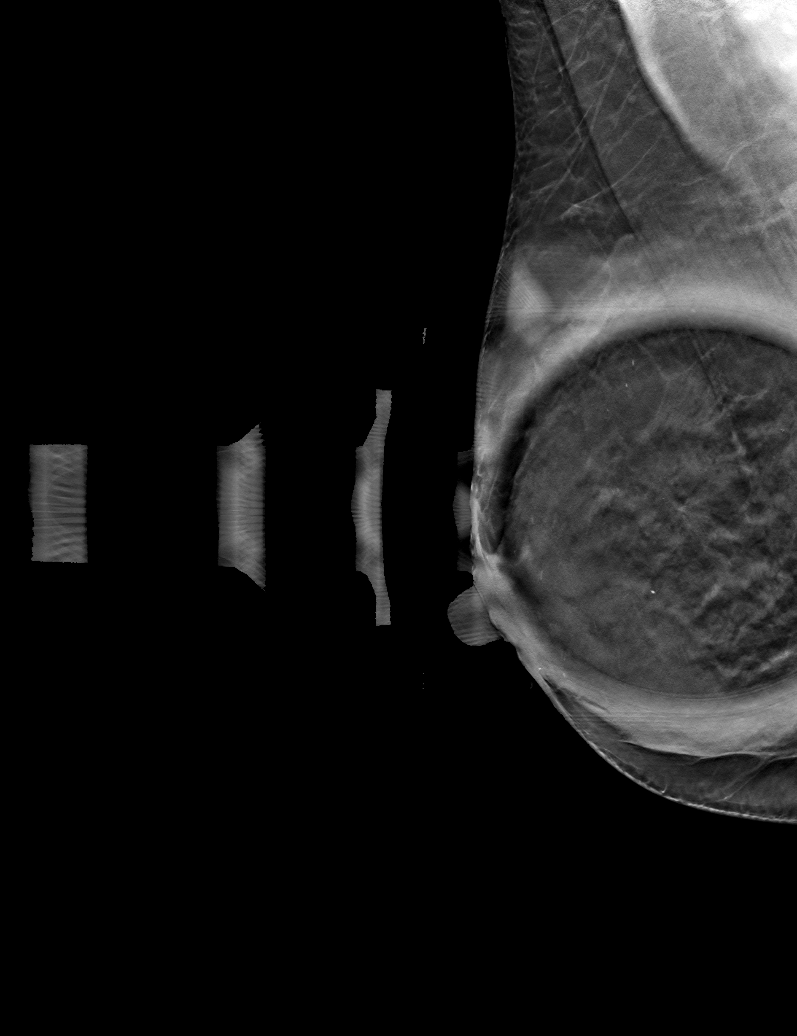

[R CC tomo · tomo slice 24/47.0]
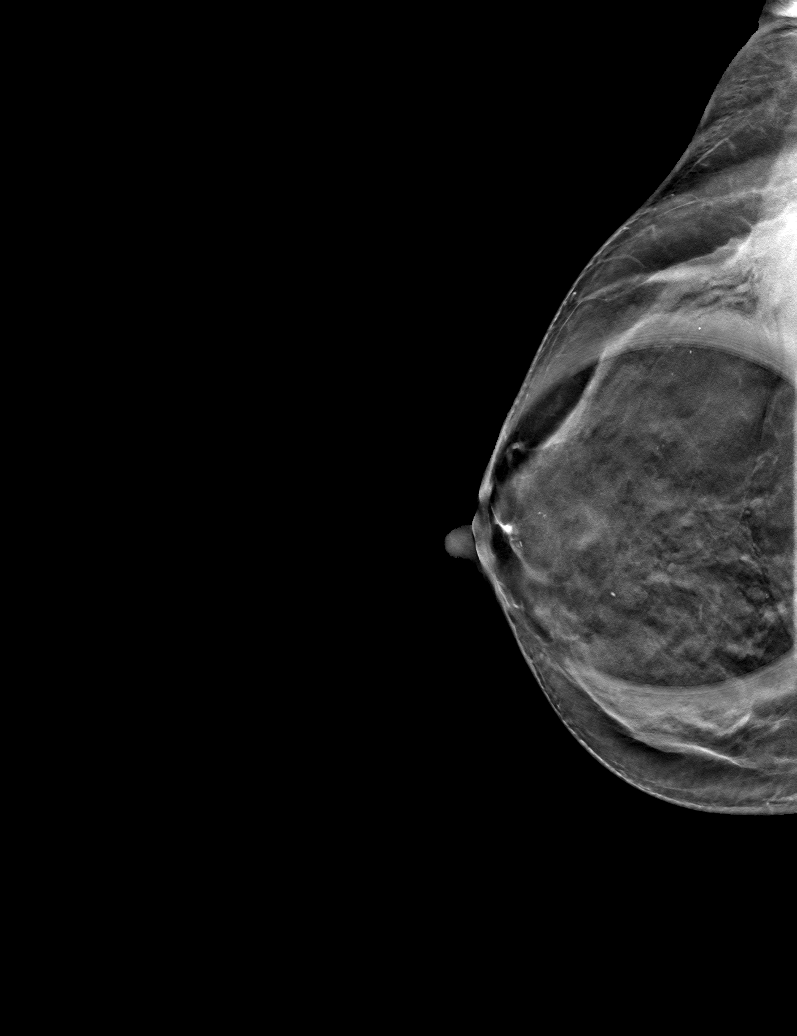

[R ML tomo · tomo slice 24/47.0]
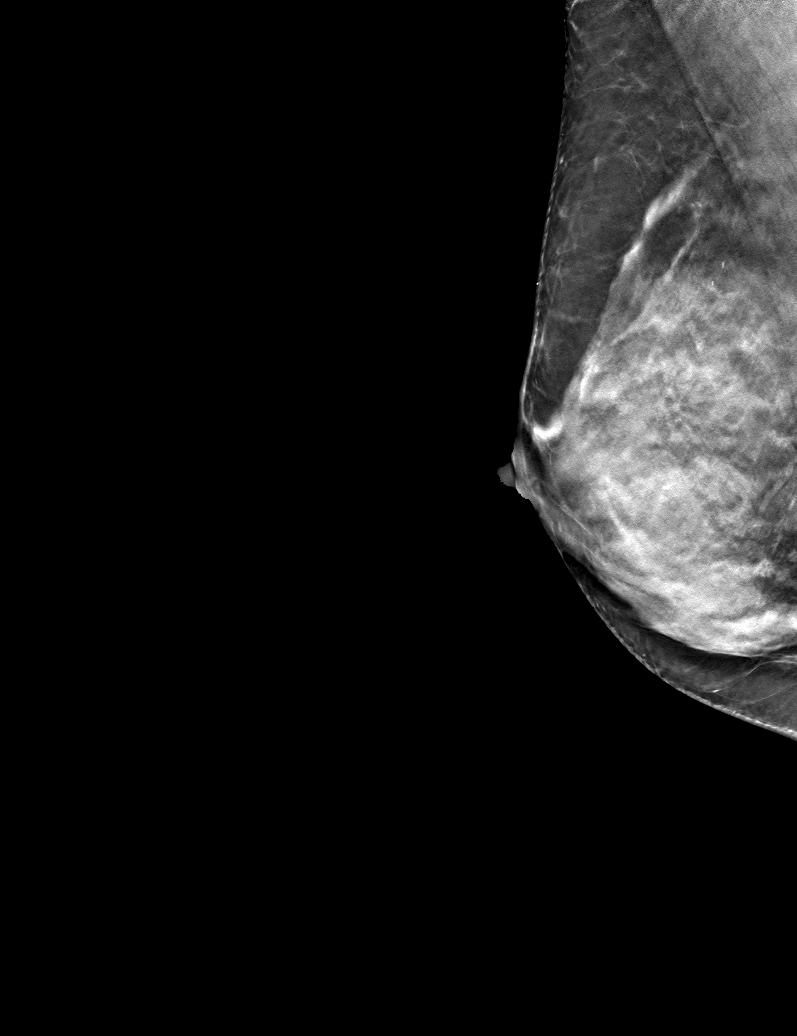

[6 of 18 positions shown; findings below may reference images not displayed]

ACR Breast Density Category d: The breast tissue is extremely dense,
which lowers the sensitivity of mammography.
FINDINGS: Spot compression tomosynthesis views demonstrate persistence of
subtle architectural distortion in the RIGHT upper inner breast at
middle depth. It is best seen on spot MLO images on spot MLO slice
23 with a favored correlate on ML slice 23. It is less conspicuous
on spot CC images. There is an oval obscured mass in the RIGHT outer
breast which is increased in size in comparison to prior mammograms.

On physical exam, no suspicious mass is appreciated in the upper
inner breast. There is a mobile mass in the RIGHT outer breast.

Targeted ultrasound was performed of the RIGHT upper inner breast.
No suspicious cystic or solid mass is seen. No definitive
sonographic correlate subtle distortion is identified.

Targeted ultrasound was performed of the RIGHT outer breast. At 9
o'clock 4 cm from the nipple, there is an oval circumscribed
anechoic mass with posterior acoustic enhancement. It measures 37 x
44 x 16 mm and is consistent with a benign simple cyst.

Targeted ultrasound was performed of the RIGHT axilla. No suspicious
axillary lymph nodes are seen.
IMPRESSION: 1. There is subtle persistent architectural distortion in the RIGHT
upper inner breast at middle depth, best seen on spot MLO and ML
imaging. This is indeterminate. Recommend stereotactic guided biopsy
for definitive characterization.
2. No suspicious RIGHT axillary lymph nodes.

RECOMMENDATION:
RIGHT breast stereotactic guided biopsy x1

I have discussed the findings and recommendations with the patient.
The biopsy procedure was discussed with the patient and questions
were answered. Patient expressed their understanding of the biopsy
recommendation. Patient will be scheduled for biopsy at her earliest
convenience by the schedulers. Ordering provider will be notified.
If applicable, a reminder letter will be sent to the patient
regarding the next appointment.

BI-RADS CATEGORY  4: Suspicious.

## 2024-01-01 ENCOUNTER — Ambulatory Visit (INDEPENDENT_AMBULATORY_CARE_PROVIDER_SITE_OTHER): Payer: 59 | Admitting: Physician Assistant

## 2024-01-01 ENCOUNTER — Other Ambulatory Visit: Payer: Self-pay | Admitting: Physician Assistant

## 2024-01-01 ENCOUNTER — Encounter: Payer: Self-pay | Admitting: Physician Assistant

## 2024-01-01 VITALS — BP 111/70 | HR 82 | Temp 98.3°F | Resp 16 | Ht 64.0 in | Wt 127.0 lb

## 2024-01-01 DIAGNOSIS — N952 Postmenopausal atrophic vaginitis: Secondary | ICD-10-CM

## 2024-01-01 DIAGNOSIS — N951 Menopausal and female climacteric states: Secondary | ICD-10-CM

## 2024-01-01 DIAGNOSIS — Z1231 Encounter for screening mammogram for malignant neoplasm of breast: Secondary | ICD-10-CM

## 2024-01-01 DIAGNOSIS — E782 Mixed hyperlipidemia: Secondary | ICD-10-CM | POA: Diagnosis not present

## 2024-01-01 DIAGNOSIS — Z1329 Encounter for screening for other suspected endocrine disorder: Secondary | ICD-10-CM | POA: Diagnosis not present

## 2024-01-01 DIAGNOSIS — Z0001 Encounter for general adult medical examination with abnormal findings: Secondary | ICD-10-CM | POA: Diagnosis not present

## 2024-01-01 DIAGNOSIS — E559 Vitamin D deficiency, unspecified: Secondary | ICD-10-CM | POA: Diagnosis not present

## 2024-01-01 DIAGNOSIS — E538 Deficiency of other specified B group vitamins: Secondary | ICD-10-CM | POA: Diagnosis not present

## 2024-01-01 MED ORDER — ESTRADIOL 0.1 MG/GM VA CREA
1.0000 | TOPICAL_CREAM | VAGINAL | 1 refills | Status: AC
Start: 2024-01-01 — End: ?

## 2024-01-01 NOTE — Progress Notes (Signed)
 [facility] [address] [place], [place] [address]  Internal MEDICINE  Office Visit Note  Patient Name: Denise Logan  [id number]  [id number]  Date of Service: 01/01/2024  Chief Complaint  Patient presents with   Annual Exam     HPI Pt is here for routine health maintenance examination -overall skin dryness as well as vaginal dryness making intercourse painful. Hx of menopausal symptoms however decided against effexor  to help vasomotor symptoms. Had discussed with GYN as well who recommended effexor  as well. May follow up with them in future but will trial vaginal estrogen cream to help dryness/atrophy. Discussed risks of HRT and will try low dose cream especially given migraine hx -does report occasional feels like she can't take a deep breath. Does admit may be in times of feeling anxious and will monitor. Doesn't happen regularly. Call if any changes -due for labs and will order--slip given -due for mammogram  Current Medication: Outpatient Encounter Medications as of 01/01/2024  Medication Sig   butalbital -aspirin-caffeine  (FIORINAL) 50-325-40 MG capsule TAKE 1 CAPSULE BY MOUTH EVERY 4 (FOUR) HOURS AS NEEDED FOR HEADACHE.   estradiol (ESTRACE) 0.1 MG/GM vaginal cream Place 1 Applicatorful vaginally once a week.   [DISCONTINUED] Cholecalciferol (VITAMIN D ) 50 MCG (2000 UT) CAPS Take by mouth.   [DISCONTINUED] hydrocortisone  2.5 % cream Apply topically 2 (two) times daily.   [DISCONTINUED] lidocaine  (XYLOCAINE ) 5 % ointment Apply 1 Application topically 4 (four) times daily as needed for mild pain.   [DISCONTINUED] Lidocaine , Anorectal, 5 % CREA Apply 1 Application topically in the morning and at bedtime.   [DISCONTINUED] ondansetron  (ZOFRAN ) 4 MG tablet Take 1 tablet (4 mg total) by mouth every 8 (eight) hours as needed for nausea or vomiting.   [DISCONTINUED] venlafaxine  XR (EFFEXOR -XR) 37.5 MG 24 hr capsule TAKE 1 CAPSULE BY MOUTH EVERY DAY   [DISCONTINUED] Vitamin D ,  Ergocalciferol , (DRISDOL ) 1.25 MG (50000 UNIT) CAPS capsule TAKE 1 CAPSULE BY MOUTH ONE TIME PER WEEK   No facility-administered encounter medications on file as of 01/01/2024.    Surgical History: Past Surgical History:  Procedure Laterality Date   BREAST BIOPSY Right 08/08/2021   affirm bx, ribbon marker,COMPATIBLE WITH RADIAL SCAR   BREAST LUMPECTOMY WITH RADIO FREQUENCY LOCALIZER Right 09/04/2021   Procedure: BREAST LUMPECTOMY WITH RADIO FREQUENCY LOCALIZER;  Surgeon: Desiderio Schanz, MD;  Location: [facility] ORS;  Service: General;  Laterality: Right;   COLONOSCOPY WITH PROPOFOL  N/A 11/22/2021   Procedure: COLONOSCOPY WITH PROPOFOL ;  Surgeon: Therisa Bi, MD;  Location: [facility] ENDOSCOPY;  Service: Gastroenterology;  Laterality: N/A;   EYE SURGERY Bilateral    lasik    Medical History: Past Medical History:  Diagnosis Date   Cyst of buttocks 2014   Heart murmur    Migraines     Family History: Family History  Problem Relation Age of Onset   Breast cancer Mother 48   Hypertension Mother    Hyperlipidemia Mother    Hypertension Father    Thyroid  disease Sister       Review of Systems  Constitutional:  Negative for chills, fatigue and unexpected weight change.  HENT:  Negative for congestion, postnasal drip, rhinorrhea, sneezing and sore throat.   Eyes:  Negative for redness.  Respiratory:  Negative for cough, chest tightness, shortness of breath and wheezing.   Cardiovascular:  Negative for chest pain and palpitations.  Gastrointestinal:  Negative for abdominal pain, constipation, diarrhea, nausea and vomiting.  Genitourinary:  Positive for dyspareunia. Negative for dysuria and frequency.  Musculoskeletal:  Negative for arthralgias, back pain, joint swelling and neck pain.  Skin:  Negative for rash.  Neurological: Negative.  Negative for tremors and numbness.  Hematological:  Negative for adenopathy. Does not bruise/bleed easily.  Psychiatric/Behavioral:  Negative for behavioral  problems (Depression), sleep disturbance and suicidal ideas. The patient is not nervous/anxious.      Vital Signs: BP 111/70   Pulse 82   Temp 98.3 F (36.8 C)   Resp 16   Ht 5' 4 (1.626 m)   Wt 127 lb (57.6 kg)   LMP 11/12/2021   SpO2 99%   BMI 21.80 kg/m    Physical Exam Vitals and nursing note reviewed.  Constitutional:      General: She is not in acute distress.    Appearance: Normal appearance. She is well-developed and normal weight. She is not diaphoretic.  HENT:     Head: Normocephalic and atraumatic.     Mouth/Throat:     Pharynx: No oropharyngeal exudate.  Eyes:     Pupils: Pupils are equal, round, and reactive to light.  Neck:     Thyroid : No thyromegaly.     Vascular: No JVD.     Trachea: No tracheal deviation.  Cardiovascular:     Rate and Rhythm: Normal rate and regular rhythm.     Heart sounds: Normal heart sounds. No murmur heard.    No friction rub. No gallop.  Pulmonary:     Effort: Pulmonary effort is normal. No respiratory distress.     Breath sounds: No wheezing or rales.  Chest:     Chest wall: No tenderness.  Breasts:    Right: Normal. No mass or tenderness.     Left: Normal. No mass or tenderness.  Abdominal:     General: Bowel sounds are normal.     Palpations: Abdomen is soft.     Tenderness: There is no abdominal tenderness.  Musculoskeletal:        General: Normal range of motion.     Cervical back: Normal range of motion and neck supple.  Lymphadenopathy:     Cervical: No cervical adenopathy.  Skin:    General: Skin is warm and dry.  Neurological:     Mental Status: She is alert and oriented to person, place, and time.  Psychiatric:        Behavior: Behavior normal.        Thought Content: Thought content normal.        Judgment: Judgment normal.      LABS: No results found for this or any previous visit (from the past 2160 hours).      Assessment/Plan: 1. Encounter for general adult medical examination with  abnormal findings (Primary) CPE performed, UTD on colonoscopy and pap, due for mammogram, lab slip given  2. Menopausal symptoms Will trial topical vaginal cream, pt may also follow up with GYN - estradiol (ESTRACE) 0.1 MG/GM vaginal cream; Place 1 Applicatorful vaginally once a week.  Dispense: 42.5 g; Refill: 1  3. Vaginal atrophy - estradiol (ESTRACE) 0.1 MG/GM vaginal cream; Place 1 Applicatorful vaginally once a week.  Dispense: 42.5 g; Refill: 1  4. Visit for screening mammogram - MM 3D SCREENING MAMMOGRAM BILATERAL BREAST; Future   General Counseling: Clessie verbalizes understanding of the findings of todays visit and agrees with plan of treatment. I have discussed any further diagnostic evaluation that may be needed or ordered today. We also reviewed her medications today. she has been encouraged to call the office with  any questions or concerns that should arise related to todays visit.    Counseling:    Orders Placed This Encounter  Procedures   MM 3D SCREENING MAMMOGRAM BILATERAL BREAST    Meds ordered this encounter  Medications   estradiol (ESTRACE) 0.1 MG/GM vaginal cream    Sig: Place 1 Applicatorful vaginally once a week.    Dispense:  42.5 g    Refill:  1    This patient was seen by Tinnie Pro, PA-C in collaboration with Dr. Sigrid Bathe as a part of collaborative care agreement.  Total time spent:35 Minutes  Time spent includes review of chart, medications, test results, and follow up plan with the patient.     Sigrid CHRISTELLA Bathe, MD  Internal Medicine

## 2024-01-02 LAB — LIPID PANEL WITH LDL/HDL RATIO
Cholesterol, Total: 238 mg/dL — ABNORMAL HIGH (ref 100–199)
HDL: 82 mg/dL (ref >39)
LDL Chol Calc (NIH): 136 mg/dL — ABNORMAL HIGH (ref 0–99)
LDL/HDL Ratio: 1.7 ratio (ref 0.0–3.2)
Triglycerides: 115 mg/dL (ref 0–149)
VLDL Cholesterol Cal: 20 mg/dL (ref 5–40)

## 2024-01-02 LAB — CBC WITH DIFFERENTIAL/PLATELET
Basophils Absolute: 0 x10E3/uL (ref 0.0–0.2)
Basos: 1 %
EOS (ABSOLUTE): 0.1 x10E3/uL (ref 0.0–0.4)
Eos: 2 %
Hematocrit: 39.4 % (ref 34.0–46.6)
Hemoglobin: 12.3 g/dL (ref 11.1–15.9)
Immature Grans (Abs): 0 x10E3/uL (ref 0.0–0.1)
Immature Granulocytes: 0 %
Lymphocytes Absolute: 1.2 x10E3/uL (ref 0.7–3.1)
Lymphs: 35 %
MCH: 26.8 pg (ref 26.6–33.0)
MCHC: 31.2 g/dL — ABNORMAL LOW (ref 31.5–35.7)
MCV: 86 fL (ref 79–97)
Monocytes Absolute: 0.3 x10E3/uL (ref 0.1–0.9)
Monocytes: 10 %
Neutrophils Absolute: 1.8 x10E3/uL (ref 1.4–7.0)
Neutrophils: 52 %
Platelets: 268 x10E3/uL (ref 150–450)
RBC: 4.59 x10E6/uL (ref 3.77–5.28)
RDW: 12.8 % (ref 11.7–15.4)
WBC: 3.4 x10E3/uL (ref 3.4–10.8)

## 2024-01-02 LAB — TSH: TSH: 1.32 u[IU]/mL (ref 0.450–4.500)

## 2024-01-02 LAB — COMPREHENSIVE METABOLIC PANEL WITH GFR
ALT: 44 IU/L — ABNORMAL HIGH (ref 0–32)
AST: 39 IU/L (ref 0–40)
Albumin: 4.6 g/dL (ref 3.8–4.9)
Alkaline Phosphatase: 77 IU/L (ref 44–121)
BUN/Creatinine Ratio: 12 (ref 9–23)
BUN: 9 mg/dL (ref 6–24)
Bilirubin Total: 0.4 mg/dL (ref 0.0–1.2)
CO2: 23 mmol/L (ref 20–29)
Calcium: 9.5 mg/dL (ref 8.7–10.2)
Chloride: 102 mmol/L (ref 96–106)
Creatinine, Ser: 0.76 mg/dL (ref 0.57–1.00)
Globulin, Total: 2.9 g/dL (ref 1.5–4.5)
Glucose: 83 mg/dL (ref 70–99)
Potassium: 4.2 mmol/L (ref 3.5–5.2)
Sodium: 139 mmol/L (ref 134–144)
Total Protein: 7.5 g/dL (ref 6.0–8.5)
eGFR: 94 mL/min/1.73 (ref >59)

## 2024-01-02 LAB — VITAMIN D 25 HYDROXY (VIT D DEFICIENCY, FRACTURES): Vit D, 25-Hydroxy: 15.9 ng/mL — ABNORMAL LOW (ref 30.0–100.0)

## 2024-01-02 LAB — B12 AND FOLATE PANEL
Folate: 14.1 ng/mL (ref >3.0)
Vitamin B-12: 705 pg/mL (ref 232–1245)

## 2024-01-02 LAB — T4, FREE: Free T4: 1.1 ng/dL (ref 0.82–1.77)

## 2024-01-06 ENCOUNTER — Ambulatory Visit: Payer: Self-pay | Admitting: Physician Assistant

## 2024-01-06 DIAGNOSIS — R7401 Elevation of levels of liver transaminase levels: Secondary | ICD-10-CM

## 2024-01-07 ENCOUNTER — Other Ambulatory Visit: Payer: Self-pay

## 2024-01-07 MED ORDER — ERGOCALCIFEROL 1.25 MG (50000 UT) PO CAPS: 50000.0000 [IU] | ORAL_CAPSULE | ORAL | 3 refills | Status: DC

## 2024-01-07 NOTE — Telephone Encounter (Signed)
-----   Message from Tinnie MARLA Pro sent at 01/06/2024  3:56 PM EDT ----- Please let her know that her Vit D is very low and send drisdol , 1 of her liver enzymes was slightly elevated and I would like to recheck this--ordered so she can go to the lab in the next 1-2  months. Avoid excess tylenol  or alcohol. Cholesterol slightly improved but still elevated. Recommend crestor  5mg  2x per week to help bring this down further. Please send if agreeable. ----- Message ----- From: Interface, [facility] Lab Results In Sent: 01/02/2024   6:53 AM EDT To: Tinnie MARLA Pro, PA-C

## 2024-01-07 NOTE — Telephone Encounter (Signed)
 Spoke with patient regarding labs, sent Drisdol  per Tinnie. Patient refused Crestor  for now.

## 2024-01-08 ENCOUNTER — Other Ambulatory Visit: Payer: Self-pay | Admitting: Internal Medicine

## 2024-01-08 DIAGNOSIS — R110 Nausea: Secondary | ICD-10-CM

## 2024-04-24 ENCOUNTER — Other Ambulatory Visit: Payer: Self-pay | Admitting: Physician Assistant

## 2024-04-26 ENCOUNTER — Ambulatory Visit
Admission: RE | Admit: 2024-04-26 | Discharge: 2024-04-26 | Disposition: A | Discharge status: Home / Self Care | Source: Ambulatory Visit | Attending: Physician Assistant | Admitting: Physician Assistant

## 2024-04-26 DIAGNOSIS — Z1231 Encounter for screening mammogram for malignant neoplasm of breast: Secondary | ICD-10-CM | POA: Insufficient documentation

## 2024-05-05 ENCOUNTER — Encounter: Payer: Self-pay | Admitting: Nurse Practitioner

## 2024-05-05 ENCOUNTER — Ambulatory Visit: Admitting: Nurse Practitioner

## 2024-05-05 VITALS — BP 99/64 | HR 90 | Temp 98.0°F | Resp 16 | Ht 64.0 in | Wt 127.2 lb

## 2024-05-05 DIAGNOSIS — J029 Acute pharyngitis, unspecified: Secondary | ICD-10-CM | POA: Diagnosis not present

## 2024-05-05 DIAGNOSIS — Z20818 Contact with and (suspected) exposure to other bacterial communicable diseases: Secondary | ICD-10-CM | POA: Diagnosis not present

## 2024-05-05 DIAGNOSIS — J020 Streptococcal pharyngitis: Secondary | ICD-10-CM

## 2024-05-05 LAB — POCT RAPID STREP A (OFFICE): Rapid Strep A Screen: NEGATIVE

## 2024-05-05 MED ORDER — AMOXICILLIN-POT CLAVULANATE 875-125 MG PO TABS
1.0000 | ORAL_TABLET | Freq: Two times a day (BID) | ORAL | 0 refills | Status: AC
Start: 2024-05-05 — End: 2024-05-15

## 2024-05-05 NOTE — Progress Notes (Unsigned)
 [facility] [address] [place], [place] [address]  Internal MEDICINE  Office Visit Note  Patient Name: Denise Logan  [id number]  [id number]  Date of Service: 05/05/2024  Chief Complaint  Patient presents with   Acute Visit    Mom and dad are sick- dad has strep throat.      HPI Denise Logan presents for an acute sick visit for strep throat --onset of symptoms was yesterday. She has been exposed to family members who have strep throat. Reprots sharp pain in throat, sore scratchy throat, runny nose, nasal congestion, mild headache, fatigue and weakness. Denies any fever, chills, cough,      Current Medication:  Outpatient Encounter Medications as of 05/05/2024  Medication Sig   butalbital -aspirin-caffeine  (FIORINAL) 50-325-40 MG capsule TAKE 1 CAPSULE BY MOUTH EVERY 4 (FOUR) HOURS AS NEEDED FOR HEADACHE.   estradiol  (ESTRACE ) 0.1 MG/GM vaginal cream Place 1 Applicatorful vaginally once a week.   Vitamin D , Ergocalciferol , (DRISDOL ) 1.25 MG (50000 UNIT) CAPS capsule TAKE 1 CAPSULE BY MOUTH ONE TIME PER WEEK   No facility-administered encounter medications on file as of 05/05/2024.      Medical History: Past Medical History:  Diagnosis Date   Cyst of buttocks 2014   Heart murmur    Migraines      Vital Signs: BP 99/64   Pulse 90   Temp 98 F (36.7 C)   Resp 16   Ht 5' 4 (1.626 m)   Wt 127 lb 3.2 oz (57.7 kg)   LMP 11/12/2021   SpO2 96%   BMI 21.83 kg/m    Review of Systems  Physical Exam    Assessment/Plan:   General Counseling: Denise Logan verbalizes understanding of the findings of todays visit and agrees with plan of treatment. I have discussed any further diagnostic evaluation that may be needed or ordered today. We also reviewed her medications today. she has been encouraged to call the office with any questions or concerns that should arise related to todays visit.    Counseling:    Orders Placed This Encounter  Procedures   POCT rapid  strep A    No orders of the defined types were placed in this encounter.   Return if symptoms worsen or fail to improve.  [place] Controlled Substance Database was reviewed by me for overdose risk score (ORS)  Time spent:*** Minutes Time spent with patient included reviewing progress notes, labs, imaging studies, and discussing plan for follow up.   This patient was seen by Denise Maxin, FNP-C in collaboration with Dr. Sigrid Logan as a part of collaborative care agreement.  Denise Knight R. Maxin, MSN, FNP-C Internal Medicine

## 2024-05-06 ENCOUNTER — Encounter: Payer: Self-pay | Admitting: Nurse Practitioner

## 2024-07-06 ENCOUNTER — Ambulatory Visit

## 2024-07-06 VITALS — BP 98/70 | HR 86 | Temp 98.0°F | Ht 64.0 in | Wt 127.0 lb

## 2024-07-06 DIAGNOSIS — F5101 Primary insomnia: Secondary | ICD-10-CM

## 2024-07-06 DIAGNOSIS — N951 Menopausal and female climacteric states: Secondary | ICD-10-CM

## 2024-07-06 DIAGNOSIS — Z23 Encounter for immunization: Secondary | ICD-10-CM

## 2024-07-06 DIAGNOSIS — R748 Abnormal levels of other serum enzymes: Secondary | ICD-10-CM

## 2024-07-06 DIAGNOSIS — R002 Palpitations: Secondary | ICD-10-CM

## 2024-07-06 DIAGNOSIS — E559 Vitamin D deficiency, unspecified: Secondary | ICD-10-CM

## 2024-07-06 NOTE — Patient Instructions (Addendum)
 Thank you for visiting [facility] today! Here's what we talked about: - You will get the heart monitor in the mail  - Call therapy for sleep

## 2024-07-06 NOTE — Progress Notes (Signed)
 "  Subjective:   This visit was conducted in person. The patient gave informed consent to the use of [employer] AI technology to record the contents of the encounter as documented below.   Patient ID: Denise Logan, female    DOB: [birth year], 54 y.o.   MRN: [id number]   Steffanie Mingle is a very pleasant 54 y.o. female who presents today as a new patient.  Past medical, surgical and family history: Reviewed and updated in chart.  Allergies: Reviewed and updated in chart.  Medications: Reviewed and updated in chart.  Social history: Reviewed and updated in chart.  Last PCP and reason for leaving: Wants to be closer   Discussed the use of AI scribe software for clinical note transcription with the patient, who gave verbal consent to proceed.  History of Present Illness Denise Logan is a 54 year old female who presents with menopausal symptoms and sleep disturbances.  She has experienced menopausal symptoms that have improved over the past two to three years, with a reduction in the frequency and severity of hot flashes and burning sensations. However, she continues to experience vaginal dryness and overall skin dryness, for which she occasionally uses Aveeno lotion. She was previously prescribed estrogen cream, which she discontinued after a lumpectomy in 2023.  She reports significant sleep disturbances, noting difficulty falling asleep and staying asleep. She typically falls asleep after midnight and wakes up around 6:00 to 6:30 AM, resulting in approximately four hours of sleep. She describes herself as a light sleeper, easily awakened by noise or the need to use the bathroom. She has tried melatonin in the past but experienced headaches as a side effect. She has not used any other prescription sleep medications.  She experiences body aches that have decreased in frequency and intensity over the past two to three years. The aches occur in various locations, including her legs and hips, and are  sometimes associated with waking up. She has undergone x-rays in the past, which did not reveal any significant findings.  She experiences headaches approximately every two months, lasting two to four days. She uses Fioricet to manage these headaches, which she describes as severe. She is unsure if the headaches are migraines but notes that they can be debilitating if not treated promptly.  She has a history of low vitamin D  levels, for which she is currently taking 5,000 units once a week. She had previously stopped taking vitamin D , which led to a significant drop in her levels, prompting the current high-dose regimen.  She occasionally experiences palpitations lasting a few seconds, without associated chest pain or shortness of breath. She also reports a sensation of being unable to take a deep breath, which she noticed about a year ago. Her sister has commented that she sounds out of breath during phone conversations.  No recent alcohol consumption, having stopped several years ago due to headaches. 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       Review of Systems  All other systems reviewed and are negative.       BP 98/70   Pulse 86   Temp 98 F (36.7 C) (Oral)   Ht 5' 4 (1.626 m)   Wt 127 lb (57.6 kg)   LMP 11/12/2021   SpO2 98%   BMI 21.80 kg/m   Objective:     Physical Exam GENERAL: Alert, cooperative, well developed, no acute distress. HEAD:  Normocephalic atraumatic. EYES: Extraocular movements intact BL, pupils round, equal and reactive to light BL, conjunctivae normal BL. CARDIOVASCULAR: Normal heart rate and rhythm, S1 and S2 normal without murmurs. CHEST: Clear to auscultation bilaterally, no wheezes, rhonchi, or crackles. EXTREMITIES: No cyanosis or edema. NEUROLOGICAL: Oriented to person, place and time, no gait abnormalities, moves all extremities  without gross motor or sensory deficit.        Assessment & Plan:   New patient, past medical and social history thoroughly reviewed and updated in chart.   Assessment & Plan Primary insomnia Chronic insomnia with difficulty initiating and maintaining sleep. Melatonin caused headaches. Open to CBT and improved sleep hygiene. - Referred to cognitive behavioral therapy for sleep. - Provided sleep hygiene tips via QR code. - Scheduled follow-up in two months to assess sleep improvement, if unimproved, consider Doxepin.  Palpitations Intermittent palpitations without associated symptoms. TSH WNL, caffeine  may be contributing. Unclear if related to reported intermittent difficulty taking deep breaths, no SOB or other respiratory symptoms - Ordered heart monitor for two weeks to assess for arrhythmias. - Performed EKG to evaluate current heart rhythm.  Vitamin D  deficiency Previously low vitamin D  levels, currently on high-dose supplementation currently. - Ordered repeat vitamin D  level with fasting lab appointment. - Will adjust vitamin D  dosage based on lab results.  Elevated liver enzymes Previous elevation in ALT at 44,  possibly related to medication or other factors vs acute phase reactant. No Tylenol  or significant alcohol use. - Ordered repeat CMP - Will consider hepatitis panel and RUQ if liver enzymes remain elevated.  Menopausal symptoms Symptoms include hot flashes, body burning, and vaginal dryness. Not significantly bothersome, wants no meds at this time. Estrogen cream discontinued as pt prefers conservative management. - Continue moisturizing skin with regular lotion. - Use estrogen cream as needed for vaginal dryness. - Consider Clonidine if sx persist/worsen  General Health Maintenance Routine health maintenance discussed. - Administered Prevnar 20 vaccine today.    Return in about 2 months (around 09/03/2024) for sleep, schedule fasting lab in a week, then also  12/31/2024 for CPE.   Denise Mcgroarty K Peace Noyes, MD  07/06/2024    Contains text generated by [employer].    "

## 2024-07-13 ENCOUNTER — Other Ambulatory Visit

## 2024-07-13 ENCOUNTER — Ambulatory Visit: Payer: Self-pay

## 2024-07-13 DIAGNOSIS — E559 Vitamin D deficiency, unspecified: Secondary | ICD-10-CM

## 2024-07-13 DIAGNOSIS — R748 Abnormal levels of other serum enzymes: Secondary | ICD-10-CM

## 2024-07-13 LAB — COMPREHENSIVE METABOLIC PANEL WITH GFR
ALT: 27 U/L (ref 3–35)
AST: 26 U/L (ref 5–37)
Albumin: 4.4 g/dL (ref 3.5–5.2)
Alkaline Phosphatase: 73 U/L (ref 39–117)
BUN: 10 mg/dL (ref 6–23)
CO2: 30 meq/L (ref 19–32)
Calcium: 9.5 mg/dL (ref 8.4–10.5)
Chloride: 101 meq/L (ref 96–112)
Creatinine, Ser: 0.64 mg/dL (ref 0.40–1.20)
GFR: 101 mL/min
Glucose, Bld: 79 mg/dL (ref 70–99)
Potassium: 3.9 meq/L (ref 3.5–5.1)
Sodium: 138 meq/L (ref 135–145)
Total Bilirubin: 0.4 mg/dL (ref 0.2–1.2)
Total Protein: 7.9 g/dL (ref 6.0–8.3)

## 2024-07-13 LAB — VITAMIN D 25 HYDROXY (VIT D DEFICIENCY, FRACTURES): VITD: 50.27 ng/mL (ref 30.00–100.00)

## 2024-07-28 NOTE — Telephone Encounter (Signed)
 EKG showed NSR, no abnormalities

## 2024-09-03 ENCOUNTER — Ambulatory Visit

## 2025-01-03 ENCOUNTER — Encounter

## 2025-01-03 ENCOUNTER — Encounter: Admitting: Physician Assistant
